# Patient Record
Sex: Female | Born: 1935 | Race: White | Hispanic: No | Smoking: Never smoker
Health system: Southern US, Community
[De-identification: ages and names within clinical notes are randomized; demographics above are authoritative.]

## PROBLEM LIST (undated history)

## (undated) ENCOUNTER — Encounter: Attending: Hematology & Oncology | Primary: Hematology & Oncology

## (undated) ENCOUNTER — Encounter

## (undated) ENCOUNTER — Ambulatory Visit

## (undated) ENCOUNTER — Non-Acute Institutional Stay: Payer: MEDICARE

## (undated) ENCOUNTER — Telehealth

## (undated) ENCOUNTER — Ambulatory Visit: Payer: MEDICARE

## (undated) ENCOUNTER — Telehealth: Attending: Hematology & Oncology | Primary: Hematology & Oncology

## (undated) ENCOUNTER — Ambulatory Visit: Payer: MEDICARE | Attending: Hematology & Oncology | Primary: Hematology & Oncology

## (undated) ENCOUNTER — Ambulatory Visit: Payer: Medicare (Managed Care)

## (undated) ENCOUNTER — Encounter: Attending: Oncology | Primary: Oncology

## (undated) ENCOUNTER — Encounter: Payer: MEDICARE | Attending: Hematology & Oncology | Primary: Hematology & Oncology

## (undated) ENCOUNTER — Ambulatory Visit: Attending: Surgery | Primary: Surgery

## (undated) ENCOUNTER — Ambulatory Visit: Payer: Medicare (Managed Care) | Attending: Hematology & Oncology | Primary: Hematology & Oncology

## (undated) ENCOUNTER — Ambulatory Visit: Attending: Hematology & Oncology | Primary: Hematology & Oncology

## (undated) ENCOUNTER — Telehealth: Attending: Surgery | Primary: Surgery

## (undated) DIAGNOSIS — E7800 Pure hypercholesterolemia, unspecified: Secondary | ICD-10-CM

## (undated) DIAGNOSIS — I10 Essential (primary) hypertension: Secondary | ICD-10-CM

## (undated) DIAGNOSIS — H9190 Unspecified hearing loss, unspecified ear: Secondary | ICD-10-CM

---

## 1997-06-29 ENCOUNTER — Other Ambulatory Visit: Admission: RE | Admit: 1997-06-29 | Discharge: 1997-06-29 | Payer: Self-pay | Admitting: Obstetrics and Gynecology

## 1999-07-18 ENCOUNTER — Encounter: Payer: Self-pay | Admitting: Obstetrics and Gynecology

## 1999-07-18 ENCOUNTER — Encounter: Admission: RE | Admit: 1999-07-18 | Discharge: 1999-07-18 | Payer: Self-pay | Admitting: Obstetrics and Gynecology

## 2000-08-07 ENCOUNTER — Encounter: Admission: RE | Admit: 2000-08-07 | Discharge: 2000-08-07 | Payer: Self-pay | Admitting: Obstetrics and Gynecology

## 2000-08-07 ENCOUNTER — Encounter: Payer: Self-pay | Admitting: Obstetrics and Gynecology

## 2001-08-12 ENCOUNTER — Encounter: Payer: Self-pay | Admitting: Obstetrics and Gynecology

## 2001-08-12 ENCOUNTER — Encounter: Admission: RE | Admit: 2001-08-12 | Discharge: 2001-08-12 | Payer: Self-pay | Admitting: Obstetrics and Gynecology

## 2002-09-09 ENCOUNTER — Encounter: Payer: Self-pay | Admitting: Obstetrics and Gynecology

## 2002-09-09 ENCOUNTER — Encounter: Admission: RE | Admit: 2002-09-09 | Discharge: 2002-09-09 | Payer: Self-pay | Admitting: Obstetrics and Gynecology

## 2003-10-05 ENCOUNTER — Encounter: Admission: RE | Admit: 2003-10-05 | Discharge: 2003-10-05 | Payer: Self-pay | Admitting: Internal Medicine

## 2006-02-12 ENCOUNTER — Encounter: Admission: RE | Admit: 2006-02-12 | Discharge: 2006-02-12 | Payer: Self-pay | Admitting: Internal Medicine

## 2007-02-24 ENCOUNTER — Encounter: Admission: RE | Admit: 2007-02-24 | Discharge: 2007-02-24 | Payer: Self-pay | Admitting: Internal Medicine

## 2008-03-15 ENCOUNTER — Encounter: Admission: RE | Admit: 2008-03-15 | Discharge: 2008-03-15 | Payer: Self-pay | Admitting: Obstetrics and Gynecology

## 2009-04-19 ENCOUNTER — Encounter: Admission: RE | Admit: 2009-04-19 | Discharge: 2009-04-19 | Payer: Self-pay | Admitting: Internal Medicine

## 2010-05-03 ENCOUNTER — Other Ambulatory Visit: Payer: Self-pay | Admitting: Internal Medicine

## 2010-05-03 DIAGNOSIS — Z1231 Encounter for screening mammogram for malignant neoplasm of breast: Secondary | ICD-10-CM

## 2010-05-23 ENCOUNTER — Ambulatory Visit
Admission: RE | Admit: 2010-05-23 | Discharge: 2010-05-23 | Disposition: A | Discharge status: Home / Self Care | Payer: Medicare Other | Source: Ambulatory Visit | Attending: Internal Medicine | Admitting: Internal Medicine

## 2010-05-23 DIAGNOSIS — Z1231 Encounter for screening mammogram for malignant neoplasm of breast: Secondary | ICD-10-CM

## 2015-01-03 ENCOUNTER — Encounter: Payer: Self-pay | Admitting: Physician Assistant

## 2015-01-24 ENCOUNTER — Other Ambulatory Visit: Payer: Self-pay | Admitting: Gastroenterology

## 2015-01-24 DIAGNOSIS — R195 Other fecal abnormalities: Secondary | ICD-10-CM

## 2015-02-01 ENCOUNTER — Ambulatory Visit: Payer: Self-pay | Admitting: Physician Assistant

## 2015-02-09 ENCOUNTER — Ambulatory Visit
Admission: RE | Admit: 2015-02-09 | Discharge: 2015-02-09 | Disposition: A | Discharge status: Home / Self Care | Payer: Medicare Other | Source: Ambulatory Visit | Attending: Gastroenterology | Admitting: Gastroenterology

## 2015-02-09 DIAGNOSIS — R195 Other fecal abnormalities: Secondary | ICD-10-CM

## 2015-02-18 ENCOUNTER — Ambulatory Visit: Payer: Self-pay | Admitting: Gastroenterology

## 2018-04-04 ENCOUNTER — Encounter (HOSPITAL_COMMUNITY): Payer: Self-pay | Admitting: *Deleted

## 2018-04-04 ENCOUNTER — Other Ambulatory Visit: Payer: Self-pay

## 2018-04-04 ENCOUNTER — Emergency Department (HOSPITAL_COMMUNITY)
Admission: EM | Admit: 2018-04-04 | Discharge: 2018-04-04 | Disposition: A | Discharge status: Home / Self Care | Payer: Medicare Other | Attending: Emergency Medicine | Admitting: Emergency Medicine

## 2018-04-04 DIAGNOSIS — I10 Essential (primary) hypertension: Secondary | ICD-10-CM | POA: Insufficient documentation

## 2018-04-04 DIAGNOSIS — Z79899 Other long term (current) drug therapy: Secondary | ICD-10-CM | POA: Diagnosis not present

## 2018-04-04 HISTORY — DX: Pure hypercholesterolemia, unspecified: E78.00

## 2018-04-04 HISTORY — DX: Essential (primary) hypertension: I10

## 2018-04-04 LAB — BASIC METABOLIC PANEL
ANION GAP: 9 (ref 5–15)
BUN: 27 mg/dL — ABNORMAL HIGH (ref 8–23)
CALCIUM: 10 mg/dL (ref 8.9–10.3)
CO2: 28 mmol/L (ref 22–32)
Chloride: 104 mmol/L (ref 98–111)
Creatinine, Ser: 0.99 mg/dL (ref 0.44–1.00)
GFR calc Af Amer: 59 mL/min — ABNORMAL LOW (ref >60)
GFR, EST NON AFRICAN AMERICAN: 51 mL/min — AB (ref >60)
GLUCOSE: 100 mg/dL — AB (ref 70–99)
Potassium: 3.9 mmol/L (ref 3.5–5.1)
Sodium: 141 mmol/L (ref 135–145)

## 2018-04-04 LAB — CBC
HCT: 40.9 % (ref 36.0–46.0)
Hemoglobin: 13.3 g/dL (ref 12.0–15.0)
MCH: 29.6 pg (ref 26.0–34.0)
MCHC: 32.5 g/dL (ref 30.0–36.0)
MCV: 91.1 fL (ref 80.0–100.0)
PLATELETS: 242 10*3/uL (ref 150–400)
RBC: 4.49 MIL/uL (ref 3.87–5.11)
RDW: 12.8 % (ref 11.5–15.5)
WBC: 6.9 10*3/uL (ref 4.0–10.5)
nRBC: 0 % (ref 0.0–0.2)

## 2018-04-04 MED ORDER — AMLODIPINE BESYLATE 5 MG PO TABS: 5.0000 mg | ORAL_TABLET | Freq: Every day | ORAL | 0 refills | Status: DC

## 2018-04-04 MED ORDER — AMLODIPINE BESYLATE 5 MG PO TABS
5.0000 mg | ORAL_TABLET | Freq: Once | ORAL | Status: AC
  Administered 2018-04-04: 5 mg via ORAL
  Filled 2018-04-04: qty 1

## 2018-04-04 NOTE — ED Triage Notes (Addendum)
Pt here from home for hypertension.  She randomly took her bp today and bp was 270/114.  Immediately experienced lightheadedness after seeing the numbers.  Lightheadedness has resolved.  Denies any headaches or any other neuro symptoms at this time.  Pt has had no changes in her hctz medications.

## 2018-04-04 NOTE — ED Notes (Signed)
Checked BP in right and left arm. RN Freida Busman informed and findings documented in chart

## 2018-04-04 NOTE — ED Notes (Signed)
Pt took home doses of her benicar 40mg  and HCTZ 25mg  tablets PO. Witnessed by [employer]. MD aware.

## 2018-04-04 NOTE — ED Notes (Signed)
Sts hasn't taken her HCTZ yet tonight. No recent changes to meds, no recent f/u with PCP.

## 2018-04-04 NOTE — ED Notes (Signed)
AVS reviewed with pt by Hervey Ard, RN. Pt expressed understanding and had no outstanding questions. Departed with family and in NAD.

## 2018-04-04 NOTE — ED Provider Notes (Signed)
[facility] Provider Note   CSN: [id number] Arrival date & time: 04/04/18  1804     History   Chief Complaint Chief Complaint  Patient presents with  . Hypertension    HPI Brandy Miranda is a 85 y.o. female.  Patient took her blood pressure this morning at home and noted to be 235 systolic.  She was asymptomatic.  Just taking her blood pressure as a matter of routine.  A few minutes after taking her blood pressure she became lightheaded for several minutes.  Symptoms resolved spontaneously.  She is presently asymptomatic without treatment.  She denies any chest pain denies abdominal pain denies headache denies visual changes no other complaint. Takes Benicar and HCTZ at night. HPI  Past Medical History:  Diagnosis Date  . Hypercholesteremia   . Hypertension     There are no active problems to display for this patient.   History reviewed. No pertinent surgical history.   OB History   No obstetric history on file.      Home Medications    Prior to Admission medications   Not on File  Benicar 40 mg daily  HCTZ 40 mg daily Atorvastatin 20 mg daily  Family History No family history on file.  Social History Social History   Tobacco Use  . Smoking status: Never Smoker  . Smokeless tobacco: Never Used  Substance Use Topics  . Alcohol use: Yes    Comment: occ  . Drug use: Never     Allergies   Patient has no known allergies.   Review of Systems Review of Systems  Constitutional: Negative.   HENT: Negative.   Respiratory: Negative.   Cardiovascular: Negative.   Gastrointestinal: Negative.   Musculoskeletal: Negative.   Skin: Negative.   Neurological: Positive for light-headedness.       Transient lightheadedness  Psychiatric/Behavioral: Negative.   All other systems reviewed and are negative.    Physical Exam Updated Vital Signs BP (!) 177/135   Pulse 91   Temp 97.9 F (36.6 C) (Oral)   Resp 18   Ht 5'  2.25" (1.581 m)   Wt 63.5 kg   SpO2 94%   BMI 25.40 kg/m   Physical Exam Vitals signs and nursing note reviewed.  Constitutional:      Appearance: She is well-developed.  HENT:     Head: Normocephalic and atraumatic.  Eyes:     Conjunctiva/sclera: Conjunctivae normal.     Pupils: Pupils are equal, round, and reactive to light.  Neck:     Musculoskeletal: Neck supple.     Thyroid: No thyromegaly.     Trachea: No tracheal deviation.  Cardiovascular:     Rate and Rhythm: Normal rate and regular rhythm.     Heart sounds: No murmur.  Pulmonary:     Effort: Pulmonary effort is normal.     Breath sounds: Normal breath sounds.  Abdominal:     General: Bowel sounds are normal. There is no distension.     Palpations: Abdomen is soft.     Tenderness: There is no abdominal tenderness.  Musculoskeletal: Normal range of motion.        General: No tenderness.  Skin:    General: Skin is warm and dry.     Findings: No rash.  Neurological:     Mental Status: She is alert and oriented to person, place, and time.     Cranial Nerves: No cranial nerve deficit.     Sensory: No  sensory deficit.     Coordination: Coordination normal.      ED Treatments / Results  Labs (all labs ordered are listed, but only abnormal results are displayed) Labs Reviewed - No data to display  EKG EKG Interpretation  Date/Time:  Friday April 04 2018 18:06:06 EST Ventricular Rate:  96 PR Interval:    QRS Duration: 96 QT Interval:  365 QTC Calculation: 462 R Axis:   39 Text Interpretation:  Sinus rhythm PVC Nonspecific T wave abnormality No old tracing to compare Confirmed by [place], Inocente Salles [phone]) on 04/04/2018 6:13:48 PM   Radiology No results found.  Procedures Procedures (including critical care time)  Medications Ordered in ED Medications - No data to display   Initial Impression / Assessment and Plan / ED Course  I have reviewed the triage vital signs and the nursing  notes.  Pertinent labs & imaging results that were available during my care of the patient were reviewed by me and considered in my medical decision making (see chart for details).     8:30 PM patient remains asymptomatic.  Plan discussed with hospital pharmacist will add Norvasc 5 mg daily to her medication regimen.  Blood pressure recheck 1 week at [place] office   There is no evidence of endorgan damage note the patient has discrepancy in blood pressures in her arms.  She has not exhibits no signs of aortic dissection.  She has had no pain.  I feel that this is likely an anomaly  Final Clinical Impressions(s) / ED Diagnoses  Diagnoses hypertension Final diagnoses:  None    ED Discharge Orders    None       Orlie Dakin, MD 04/04/18 2034

## 2018-04-04 NOTE — Discharge Instructions (Signed)
Call Dr. Keane Police office on Monday, 04/07/2020 arrange to get your blood pressure rechecked in his office next week.

## 2018-04-08 ENCOUNTER — Other Ambulatory Visit (HOSPITAL_COMMUNITY): Payer: Self-pay | Admitting: Internal Medicine

## 2018-04-08 ENCOUNTER — Other Ambulatory Visit: Payer: Self-pay | Admitting: Internal Medicine

## 2018-04-09 ENCOUNTER — Other Ambulatory Visit (HOSPITAL_COMMUNITY): Payer: Self-pay | Admitting: Internal Medicine

## 2018-04-09 ENCOUNTER — Other Ambulatory Visit: Payer: Self-pay | Admitting: Internal Medicine

## 2018-04-09 DIAGNOSIS — R011 Cardiac murmur, unspecified: Secondary | ICD-10-CM

## 2018-04-09 DIAGNOSIS — I129 Hypertensive chronic kidney disease with stage 1 through stage 4 chronic kidney disease, or unspecified chronic kidney disease: Secondary | ICD-10-CM

## 2018-04-11 ENCOUNTER — Ambulatory Visit (HOSPITAL_COMMUNITY): Payer: Medicare Other | Attending: Cardiology

## 2018-04-11 DIAGNOSIS — I129 Hypertensive chronic kidney disease with stage 1 through stage 4 chronic kidney disease, or unspecified chronic kidney disease: Secondary | ICD-10-CM | POA: Diagnosis present

## 2018-04-11 DIAGNOSIS — R011 Cardiac murmur, unspecified: Secondary | ICD-10-CM | POA: Insufficient documentation

## 2019-03-06 DIAGNOSIS — C801 Malignant (primary) neoplasm, unspecified: Secondary | ICD-10-CM

## 2019-03-06 HISTORY — DX: Malignant (primary) neoplasm, unspecified: C80.1

## 2019-10-19 ENCOUNTER — Other Ambulatory Visit: Payer: Self-pay | Admitting: General Surgery

## 2019-10-19 DIAGNOSIS — C4359 Malignant melanoma of other part of trunk: Secondary | ICD-10-CM

## 2019-10-29 ENCOUNTER — Encounter (HOSPITAL_COMMUNITY): Payer: Self-pay

## 2019-10-29 ENCOUNTER — Ambulatory Visit (HOSPITAL_COMMUNITY)
Admission: RE | Admit: 2019-10-29 | Discharge: 2019-10-29 | Disposition: A | Discharge status: Home / Self Care | Payer: Medicare Other | Source: Ambulatory Visit | Attending: General Surgery | Admitting: General Surgery

## 2019-10-29 ENCOUNTER — Encounter (HOSPITAL_COMMUNITY)
Admission: RE | Admit: 2019-10-29 | Discharge: 2019-10-29 | Disposition: A | Discharge status: Home / Self Care | Payer: Medicare Other | Source: Ambulatory Visit | Attending: General Surgery | Admitting: General Surgery

## 2019-10-29 ENCOUNTER — Other Ambulatory Visit: Payer: Self-pay

## 2019-10-29 DIAGNOSIS — D0359 Melanoma in situ of other part of trunk: Secondary | ICD-10-CM | POA: Insufficient documentation

## 2019-10-29 HISTORY — DX: Unspecified hearing loss, unspecified ear: H91.90

## 2019-10-29 LAB — CBC WITH DIFFERENTIAL/PLATELET
Abs Immature Granulocytes: 0.03 10*3/uL (ref 0.00–0.07)
Basophils Absolute: 0 10*3/uL (ref 0.0–0.1)
Basophils Relative: 0 %
Eosinophils Absolute: 0 10*3/uL (ref 0.0–0.5)
Eosinophils Relative: 0 %
HCT: 40.5 % (ref 36.0–46.0)
Hemoglobin: 12.9 g/dL (ref 12.0–15.0)
Immature Granulocytes: 0 %
Lymphocytes Relative: 28 %
Lymphs Abs: 2.1 10*3/uL (ref 0.7–4.0)
MCH: 29.7 pg (ref 26.0–34.0)
MCHC: 31.9 g/dL (ref 30.0–36.0)
MCV: 93.3 fL (ref 80.0–100.0)
Monocytes Absolute: 0.7 10*3/uL (ref 0.1–1.0)
Monocytes Relative: 9 %
Neutro Abs: 4.7 10*3/uL (ref 1.7–7.7)
Neutrophils Relative %: 63 %
Platelets: 270 10*3/uL (ref 150–400)
RBC: 4.34 MIL/uL (ref 3.87–5.11)
RDW: 12.8 % (ref 11.5–15.5)
WBC: 7.5 10*3/uL (ref 4.0–10.5)
nRBC: 0 % (ref 0.0–0.2)

## 2019-10-29 LAB — COMPREHENSIVE METABOLIC PANEL
ALT: 18 U/L (ref 0–44)
AST: 21 U/L (ref 15–41)
Albumin: 3.9 g/dL (ref 3.5–5.0)
Alkaline Phosphatase: 44 U/L (ref 38–126)
Anion gap: 8 (ref 5–15)
BUN: 19 mg/dL (ref 8–23)
CO2: 28 mmol/L (ref 22–32)
Calcium: 9.1 mg/dL (ref 8.9–10.3)
Chloride: 103 mmol/L (ref 98–111)
Creatinine, Ser: 0.98 mg/dL (ref 0.44–1.00)
GFR calc Af Amer: 58 mL/min — ABNORMAL LOW (ref >60)
GFR calc non Af Amer: 50 mL/min — ABNORMAL LOW (ref >60)
Glucose, Bld: 94 mg/dL (ref 70–99)
Potassium: 3.9 mmol/L (ref 3.5–5.1)
Sodium: 139 mmol/L (ref 135–145)
Total Bilirubin: 0.7 mg/dL (ref 0.3–1.2)
Total Protein: 7.1 g/dL (ref 6.5–8.1)

## 2019-10-29 NOTE — Progress Notes (Signed)
Anesthesia Chart Review:  Case: [id number] Date/Time: 11/04/19 0845   Procedure: WIDE LOCAL EXCISION  LEFT UPPER BACK MELANOMA WITH ADVANCEMENT FLAP CLOSURE,  SENTINEL LYMPH NODE BIOPSY (Left )   Anesthesia type: General   Pre-op diagnosis: LEFT UPPER BACK MELANOMA   Location: [facility] OR ROOM 02 / [facility]   Surgeons: [name], MD      DISCUSSION: Patient is a 86 year old Miranda scheduled for the above procedure.  History includes never smoker, HTN, hypercholesterolemia, hard of hearing, melanoma (2021).   Reported last ASA 10/27/19. 2020 echo (ordered by PCP for HTN, murmur) showed LVEF > 65%, impaired diastolic relaxation, mild mitral annular calcification, moderate aortic valve calcification with no AS by Doppler. EKG showed SR with sinus arrhythmia. She denied chest pain, SOB, cough, fever at PAT RN visit.  Labs unremarkable.  Preoperative COVID-19 testing is scheduled for 10/31/2019. Anesthesia team to evaluate on the day of surgery.    VS: BP (!) 143/61   Pulse 79   Temp 36.7 C (Oral)   Resp 18   Ht 5' 2.25" (1.581 m)   Wt 61.8 kg   SpO2 99%   BMI 24.73 kg/m   PROVIDERS: Shon Baton, MD his PCP   LABS: Labs reviewed: Acceptable for surgery. (all labs ordered are listed, but only abnormal results are displayed)  Labs Reviewed  COMPREHENSIVE METABOLIC PANEL - Abnormal; Notable for the following components:      Result Value   GFR calc non Af Amer 50 (*)    GFR calc Af Amer Brandy (*)    All other components within normal limits  CBC WITH DIFFERENTIAL/PLATELET    IMAGES: CXR 10/29/19:  FINDINGS: Heart and mediastinal contours are within normal limits. No focal opacities or effusions. No acute bony abnormality. IMPRESSION: No active cardiopulmonary disease.   EKG: 10/29/19:  Normal sinus rhythm with sinus arrhythmia Possible Left atrial enlargement Borderline ECG Confirmed by Croitoru, Mihai [phone]) on 10/29/2019 1:41:44 PM   CV: Echo 04/11/18: IMPRESSIONS  1. The  left ventricle has hyperdynamic systolic function of >36%. The  cavity size was normal. There is no increased left ventricular wall  thickness. Echo evidence of impaired diastolic relaxation.  2. The right ventricle has normal systolic function. The cavity was  normal. There is no increase in right ventricular wall thickness.  3. The mitral valve is normal in structure. There is mild mitral annular  calcification present.  4. The tricuspid valve is normal in structure.  5. The aortic valve has an indeterminant number of cusps There is  moderate calcification of the aortic valve.  6. The pulmonic valve was normal in structure.  7. Vigorous LV systolic function; mild diastolic dysfunction; calcified  aortic valve but no AS by doppler.    Past Medical History:  Diagnosis Date  . Cancer ([place]) 2021   Melanoma, back  . HOH (hard of hearing)    Wears bilateral hearing aids  . Hypercholesteremia   . Hypertension     History reviewed. No pertinent surgical history.  MEDICATIONS: . amLODipine (NORVASC) 10 MG tablet  . amLODipine (NORVASC) 5 MG tablet  . Ascorbic Acid (VITAMIN C PO)  . aspirin EC 81 MG tablet  . atorvastatin (LIPITOR) 20 MG tablet  . hydrochlorothiazide (HYDRODIURIL) 25 MG tablet  . olmesartan (BENICAR) 40 MG tablet  . Olopatadine HCl (PATADAY OP)  . OVER THE COUNTER MEDICATION  . OVER THE COUNTER MEDICATION  . OVER THE COUNTER MEDICATION  . OVER THE COUNTER MEDICATION  .  triamcinolone ointment (KENALOG) 0.1 %   No current facility-administered medications for this encounter.     Myra Gianotti, PA-C Surgical Short Stay/Anesthesiology [facility] Phone [phone] [facility] Phone [phone] 10/29/2019 6:06 PM

## 2019-10-29 NOTE — Progress Notes (Addendum)
PCP - Shon Baton, MD Cardiologist - Denies  PPM/ICD - Denies  Chest x-ray - 10/29/19 EKG - 10/29/19 Stress Test - Denies ECHO - 04/11/18 Cardiac Cath - Denies  Sleep Study - Denies CPAP -  N/A  Patient denies being diabetic.  Blood Thinner Instructions: N/A Aspirin Instructions: Per patient, last dose 10/27/19  ERAS Protcol - Yes PRE-SURGERY Ensure or G2- Not ordered  COVID TEST- 10/31/19   Anesthesia review: Review echo; abnormal EKG.   Patient denies shortness of breath, fever, cough and chest pain at PAT appointment   All instructions explained to the patient, with a verbal understanding of the material. Patient agrees to go over the instructions while at home for a better understanding. Patient also instructed to self quarantine after being tested for COVID-19. The opportunity to ask questions was provided.

## 2019-10-29 NOTE — Anesthesia Preprocedure Evaluation (Addendum)
Anesthesia Evaluation  Patient identified by MRN, date of birth, ID band Patient awake    Reviewed: Allergy & Precautions, NPO status , Patient's Chart, lab work & pertinent test results  History of Anesthesia Complications Negative for: history of anesthetic complications  Airway Mallampati: II  TM Distance: >3 FB Neck ROM: Full    Dental no notable dental hx.    Pulmonary neg pulmonary ROS,    Pulmonary exam normal        Cardiovascular hypertension, Pt. on medications Normal cardiovascular exam     Neuro/Psych negative neurological ROS  negative psych ROS   GI/Hepatic negative GI ROS, Neg liver ROS,   Endo/Other  negative endocrine ROS  Renal/GU negative Renal ROS  negative genitourinary   Musculoskeletal negative musculoskeletal ROS (+)   Abdominal   Peds  Hematology negative hematology ROS (+)   Anesthesia Other Findings Left upper back melanoma  Reproductive/Obstetrics negative OB ROS                           Anesthesia Physical Anesthesia Plan  ASA: II  Anesthesia Plan: General   Post-op Pain Management:    Induction: Intravenous  PONV Risk Score and Plan: 3 and Treatment may vary due to age or medical condition, Ondansetron and Dexamethasone  Airway Management Planned: Oral ETT  Additional Equipment: None  Intra-op Plan:   Post-operative Plan: Extubation in OR  Informed Consent: I have reviewed the patients History and Physical, chart, labs and discussed the procedure including the risks, benefits and alternatives for the proposed anesthesia with the patient or authorized representative who has indicated his/her understanding and acceptance.     Dental advisory given and Consent reviewed with POA  Plan Discussed with: CRNA  Anesthesia Plan Comments: (Consent discussed with patient and patient's daughter in preop room. Daiva Huge, MD )      Anesthesia Quick  Evaluation

## 2019-10-29 NOTE — Pre-Procedure Instructions (Signed)
Your procedure is scheduled on Wednesday, September 1, from 09:00 AM- 10:30 AM.  Report to [facility] Main Entrance "A" at 07:00 A.M., and check in at the Admitting office.  Call this number if you have problems the morning of surgery:  [phone]  Call [phone] if you have any questions prior to your surgery date Monday-Friday 8am-4pm.    Remember:  Do not eat after midnight the night before your surgery.  You may drink clear liquids until 06:00 AM the morning of your surgery.   Clear liquids allowed are: Water, Non-Citrus Juices (without pulp), Carbonated Beverages, Clear Tea, Black Coffee Only, and Gatorade.    Take these medicines the morning of surgery with A SIP OF WATER: amLODipine (NORVASC) atorvastatin (LIPITOR)  Olopatadine HCl (PATADAY OP) eye drops- if needed   Follow your surgeon's instructions on when to stop Aspirin.  If no instructions were given by your surgeon then you will need to call the office to get those instructions.    As of today, STOP taking any Aleve, Naproxen, Ibuprofen, Motrin, Advil, Goody's, BC's, all herbal medications, fish oil, and all vitamins.     The Morning of Surgery:                 Do not wear jewelry, make up, or nail polish.            Do not wear lotions, powders, perfumes, or deodorant.            Do not shave 48 hours prior to surgery.              Do not bring valuables to the hospital.            [facility] is not responsible for any belongings or valuables.  Do NOT Smoke (Tobacco/Vaping) or drink Alcohol 24 hours prior to your procedure.  If you use a CPAP at night, you may bring all equipment for your overnight stay.   Contacts, glasses, dentures or bridgework may not be worn into surgery.      For patients admitted to the hospital, discharge time will be determined by your treatment team.   Patients discharged the day of surgery will not be allowed to drive home, and someone needs to stay with them for 24  hours.    Special instructions:   [place]- Preparing For Surgery  Before surgery, you can play an important role. Because skin is not sterile, your skin needs to be as free of germs as possible. You can reduce the number of germs on your skin by washing with CHG (chlorahexidine gluconate) Soap before surgery.  CHG is an antiseptic cleaner which kills germs and bonds with the skin to continue killing germs even after washing.    Oral Hygiene is also important to reduce your risk of infection.  Remember - BRUSH YOUR TEETH THE MORNING OF SURGERY WITH YOUR REGULAR TOOTHPASTE  Please do not use if you have an allergy to CHG or antibacterial soaps. If your skin becomes reddened/irritated stop using the CHG.  Do not shave (including legs and underarms) for at least 48 hours prior to first CHG shower. It is OK to shave your face.  Please follow these instructions carefully.   1. Shower the NIGHT BEFORE SURGERY and the MORNING OF SURGERY with CHG Soap.   2. If you chose to wash your hair, wash your hair first as usual with your normal shampoo.  3. After you shampoo, rinse your hair  and body thoroughly to remove the shampoo.  4. Use CHG as you would any other liquid soap. You can apply CHG directly to the skin and wash gently with a scrungie or a clean washcloth.   5. Apply the CHG Soap to your body ONLY FROM THE NECK DOWN.  Do not use on open wounds or open sores. Avoid contact with your eyes, ears, mouth and genitals (private parts). Wash Face and genitals (private parts)  with your normal soap.   6. Wash thoroughly, paying special attention to the area where your surgery will be performed.  7. Thoroughly rinse your body with warm water from the neck down.  8. DO NOT shower/wash with your normal soap after using and rinsing off the CHG Soap.  9. Pat yourself dry with a CLEAN TOWEL.  10. Wear CLEAN PAJAMAS to bed the night before surgery  11. Place CLEAN SHEETS on your bed the night of  your first shower and DO NOT SLEEP WITH PETS.   Day of Surgery: Wear Clean/Comfortable clothing the morning of surgery. Do not apply any deodorants/lotions.   Remember to brush your teeth WITH YOUR REGULAR TOOTHPASTE.   Please read over the following fact sheets that you were given.

## 2019-10-31 ENCOUNTER — Other Ambulatory Visit (HOSPITAL_COMMUNITY)
Admission: RE | Admit: 2019-10-31 | Discharge: 2019-10-31 | Disposition: A | Discharge status: Home / Self Care | Payer: Medicare Other | Source: Ambulatory Visit | Attending: General Surgery | Admitting: General Surgery

## 2019-10-31 DIAGNOSIS — Z01812 Encounter for preprocedural laboratory examination: Secondary | ICD-10-CM | POA: Diagnosis present

## 2019-10-31 DIAGNOSIS — Z20822 Contact with and (suspected) exposure to covid-19: Secondary | ICD-10-CM | POA: Diagnosis not present

## 2019-10-31 LAB — SARS CORONAVIRUS 2 (TAT 6-24 HRS): SARS Coronavirus 2: NEGATIVE

## 2019-11-02 NOTE — H&P (Signed)
Brandy Miranda Appointment: 10/19/2019 9:30 AM Location: [place] Office Patient #: [id number] DOB: September 28, 1926 Widowed / Language: Brandy Miranda / Race: [place]ra[place] Female   History of Present Illness Brandy Miranda; 10/19/2019 7:48 PM) The patient is a 86 year old female who presents with malignant melanoma. Pt is a 86 yo F who is referred for melanoma of the left upper back by Dr. Martin Miranda. This is dx 10/2019. She noted some itching on the back and felt something there. It was originally flat, but started to become raised over a month or so. She called for an appointment and biopsy was positive for melanoma. She isn't sure what the lesion looked like, but her daughter who is present, thought it looked a bit like a blister.  Biopsy showed a 3.6 mm melanoma that was superficial spreading and nodular. This had positive peripheral margins and negative deep margins. There was no ulceration, satellitosis, LVI, neurotropism, or regression. Numerous mitotic figures were seen at 11/mm2. She had non brisk TILs. This was staged as a pT3a.    She has no personal or family history of cancer. She has never had surgery. She walks multiple miles every day.  [name] dx [id number]   Past Surgical History (Brandy Miranda, CMA; 10/19/2019 9:39 AM) No pertinent past surgical history   Diagnostic Studies History (Brandy Miranda, [place]; 10/19/2019 9:39 AM) Colonoscopy  1-5 years ago  Allergies (Brandy Miranda, CMA; 10/19/2019 9:40 AM) No Known Drug Allergies  [10/19/2019]:  Medication History (Brandy Miranda, CMA; 10/19/2019 9:43 AM) Multi-Vitamin (Oral) Active. Essential Oils Active. amLODIPine Besylate (5MG  Tablet, Oral) Active. Aspirin (81MG  Tablet, Oral) Active. Lipitor (20MG  Tablet, Oral) Active. hydroCHLOROthiazide (25MG  Tablet, Oral) Active. Benicar (40MG  Tablet, Oral) Active. Medications Reconciled  Social History (Brandy Miranda, CMA; 10/19/2019 9:39 AM) Tobacco use  Never smoker.  Family  History (Brandy Miranda, [place]; 10/19/2019 9:39 AM) Alcohol Abuse  Son.  Pregnancy / Birth History (Brandy Miranda, [place]; 10/19/2019 9:39 AM) Age at menarche  19 years. Gravida  5 Maternal age  25-30 Para  68  Other Problems (Brandy Miranda, CMA; 10/19/2019 9:39 AM) Heart murmur  High blood pressure  Hypercholesterolemia     Review of Systems (Brandy Miranda CMA; 10/19/2019 9:39 AM) General Present- Weight Loss. Not Present- Appetite Loss, Chills, Fatigue, Fever, Night Sweats and Weight Gain. Skin Present- Dryness. Not Present- Change in Wart/Mole, Hives, Jaundice, New Lesions, Non-Healing Wounds, Rash and Ulcer. HEENT Present- Hearing Loss. Not Present- Earache, Hoarseness, Nose Bleed, Oral Ulcers, Ringing in the Ears, Seasonal Allergies, Sinus Pain, Sore Throat, Visual Disturbances, Wears glasses/contact lenses and Yellow Eyes. Female Genitourinary Present- Nocturia. Not Present- Frequency, Painful Urination, Pelvic Pain and Urgency. Musculoskeletal Present- Joint Stiffness. Not Present- Back Pain, Joint Pain, Muscle Pain, Muscle Weakness and Swelling of Extremities. Neurological Not Present- Decreased Memory, Fainting, Headaches, Numbness, Seizures, Tingling, Tremor, Trouble walking and Weakness. Psychiatric Not Present- Anxiety, Bipolar, Change in Sleep Pattern, Depression, Fearful and Frequent crying.  Vitals (Brandy Miranda CMA; 10/19/2019 9:44 AM) 10/19/2019 9:43 AM Weight: 135.38 lb Height: 62.25in Body Surface Area: 1.62 m Body Mass Index: 24.56 kg/m  Temp.: 97.52F (Temporal)  Pulse: 86 (Regular)  P.OX: 97% (Room air) BP: 140/70(Sitting, Left Arm, Standard)       Physical Exam Brandy Miranda; 10/19/2019 7:49 PM) General Mental Status-Alert. General Appearance-Consistent with stated age. Hydration-Well hydrated. Voice-Normal.  Integumentary Note: shave biopsy site still a bit raw on left upper back in bra line. no residual pigment noted. no other  nodules or lesions noted  nearby.   Head and Neck Head-normocephalic, atraumatic with no lesions or palpable masses. Trachea-midline. Thyroid Gland Characteristics - normal size and consistency.  Eye Eyeball - Bilateral-Extraocular movements intact. Sclera/Conjunctiva - Bilateral-No scleral icterus.  Chest and Lung Exam Chest and lung exam reveals -quiet, even and easy respiratory effort with no use of accessory muscles and on auscultation, normal breath sounds, no adventitious sounds and normal vocal resonance. Inspection Chest Wall - Normal. Back - normal.  Cardiovascular Cardiovascular examination reveals -normal heart sounds, regular rate and rhythm with no murmurs and normal pedal pulses bilaterally.  Abdomen Inspection Inspection of the abdomen reveals - No Hernias. Palpation/Percussion Palpation and Percussion of the abdomen reveal - Soft, Non Tender, No Rebound tenderness, No Rigidity (guarding) and No hepatosplenomegaly. Auscultation Auscultation of the abdomen reveals - Bowel sounds normal.  Neurologic Neurologic evaluation reveals -alert and oriented x 3 with no impairment of recent or remote memory. Mental Status-Normal.  Musculoskeletal Global Assessment -Note: no gross deformities.  Normal Exam - Left-Upper Extremity Strength Normal and Lower Extremity Strength Normal. Normal Exam - Right-Upper Extremity Strength Normal and Lower Extremity Strength Normal.  Lymphatic Head & Neck  General Head & Neck Lymphatics: Bilateral - Description - Normal. Axillary  General Axillary Region: Bilateral - Description - Normal. Tenderness - Non Tender. Femoral & Inguinal  Generalized Femoral & Inguinal Lymphatics: Bilateral - Description - No Generalized lymphadenopathy.    Assessment & Plan Brandy Miranda; 10/19/2019 7:53 PM) MALIGNANT MELANOMA OF UPPER BACK (C43.59) Impression: Pt will need WLE and advancement flap closure 2 cm margins of  this melanoma. She is 76, but in excellent shape with a mother that lived to over 80. I will plan SLN bx for this as well. The nodular portion of the melanoma is a higher risk lesion. I discussed surgery in detail with pt and daughter.  I reviewed risks and recovery. I discussed post op expectations of numbness at incision and some central sutures that will have to be removed. I discussed risk of wound breakdown. I also discussed risk of cancer recurrence. will do this as soon as possible. Current Plans You are being scheduled for surgery- Our schedulers will call you.  You should hear from our office's scheduling department within 5 working days about the location, date, and time of surgery. We try to make accommodations for patient's preferences in scheduling surgery, but sometimes the OR schedule or the surgeon's schedule prevents [place] from making those accommodations.  If you have not heard from our office [phone]) in 5 working days, call the office and ask for your surgeon's nurse.  If you have other questions about your diagnosis, plan, or surgery, call the office and ask for your surgeon's nurse.  Pt Education - Melanoma: melanoma Pt Education - [facility] Free Text Education/Instructions: discussed with patient and provided information.   Signed electronically by Brandy Klein, Miranda (10/19/2019 7:53 PM)

## 2019-11-04 ENCOUNTER — Encounter (HOSPITAL_COMMUNITY): Payer: Self-pay | Admitting: General Surgery

## 2019-11-04 ENCOUNTER — Encounter (HOSPITAL_COMMUNITY)
Admission: RE | Admit: 2019-11-04 | Discharge: 2019-11-04 | Disposition: A | Discharge status: Home / Self Care | Payer: Medicare Other | Source: Ambulatory Visit | Attending: General Surgery | Admitting: General Surgery

## 2019-11-04 ENCOUNTER — Ambulatory Visit (HOSPITAL_COMMUNITY): Payer: Medicare Other | Admitting: Vascular Surgery

## 2019-11-04 ENCOUNTER — Encounter (HOSPITAL_COMMUNITY)
Admission: RE | Disposition: A | Discharge status: Home / Self Care | Payer: Self-pay | Source: Home / Self Care | Attending: General Surgery

## 2019-11-04 ENCOUNTER — Ambulatory Visit (HOSPITAL_COMMUNITY): Payer: Medicare Other | Admitting: Anesthesiology

## 2019-11-04 ENCOUNTER — Ambulatory Visit (HOSPITAL_COMMUNITY)
Admission: RE | Admit: 2019-11-04 | Discharge: 2019-11-04 | Disposition: A | Discharge status: Home / Self Care | Payer: Medicare Other | Attending: General Surgery | Admitting: General Surgery

## 2019-11-04 ENCOUNTER — Other Ambulatory Visit: Payer: Self-pay

## 2019-11-04 DIAGNOSIS — C4359 Malignant melanoma of other part of trunk: Secondary | ICD-10-CM | POA: Insufficient documentation

## 2019-11-04 DIAGNOSIS — R011 Cardiac murmur, unspecified: Secondary | ICD-10-CM | POA: Insufficient documentation

## 2019-11-04 DIAGNOSIS — C773 Secondary and unspecified malignant neoplasm of axilla and upper limb lymph nodes: Secondary | ICD-10-CM | POA: Diagnosis not present

## 2019-11-04 DIAGNOSIS — Z811 Family history of alcohol abuse and dependence: Secondary | ICD-10-CM | POA: Diagnosis not present

## 2019-11-04 DIAGNOSIS — Z79899 Other long term (current) drug therapy: Secondary | ICD-10-CM | POA: Insufficient documentation

## 2019-11-04 DIAGNOSIS — E7800 Pure hypercholesterolemia, unspecified: Secondary | ICD-10-CM | POA: Insufficient documentation

## 2019-11-04 DIAGNOSIS — I10 Essential (primary) hypertension: Secondary | ICD-10-CM | POA: Diagnosis not present

## 2019-11-04 HISTORY — PX: MELANOMA EXCISION WITH SENTINEL LYMPH NODE BIOPSY: SHX5267

## 2019-11-04 HISTORY — PX: SENTINEL NODE BIOPSY: SHX6608

## 2019-11-04 SURGERY — MELANOMA EXCISION WITH SENTINEL LYMPH NODE BIOPSY
Anesthesia: General | Site: Back | Laterality: Left

## 2019-11-04 MED ORDER — SODIUM CHLORIDE (PF) 0.9 % IJ SOLN
INTRAMUSCULAR | Status: AC
  Filled 2019-11-04: qty 10

## 2019-11-04 MED ORDER — ACETAMINOPHEN 500 MG PO TABS
1000.0000 mg | ORAL_TABLET | Freq: Once | ORAL | Status: AC
  Administered 2019-11-04: 1000 mg via ORAL
  Filled 2019-11-04: qty 2

## 2019-11-04 MED ORDER — PHENYLEPHRINE 40 MCG/ML (10ML) SYRINGE FOR IV PUSH (FOR BLOOD PRESSURE SUPPORT)
PREFILLED_SYRINGE | INTRAVENOUS | Status: DC | PRN
  Administered 2019-11-04 (×2): 80 ug via INTRAVENOUS
  Administered 2019-11-04: 40 ug via INTRAVENOUS
  Administered 2019-11-04: 120 ug via INTRAVENOUS
  Administered 2019-11-04: 80 ug via INTRAVENOUS

## 2019-11-04 MED ORDER — LIDOCAINE-EPINEPHRINE 1 %-1:100000 IJ SOLN
INTRAMUSCULAR | Status: AC
  Filled 2019-11-04: qty 1

## 2019-11-04 MED ORDER — DEXAMETHASONE SODIUM PHOSPHATE 4 MG/ML IJ SOLN
INTRAMUSCULAR | Status: DC | PRN
  Administered 2019-11-04: 4 mg via INTRAVENOUS

## 2019-11-04 MED ORDER — PROMETHAZINE HCL 25 MG/ML IJ SOLN: 6.2500 mg | INTRAMUSCULAR | Status: DC | PRN

## 2019-11-04 MED ORDER — BUPIVACAINE-EPINEPHRINE (PF) 0.25% -1:200000 IJ SOLN
INTRAMUSCULAR | Status: AC
  Filled 2019-11-04: qty 30

## 2019-11-04 MED ORDER — DEXAMETHASONE SODIUM PHOSPHATE 10 MG/ML IJ SOLN
INTRAMUSCULAR | Status: AC
  Filled 2019-11-04: qty 1

## 2019-11-04 MED ORDER — MIDAZOLAM HCL 2 MG/2ML IJ SOLN
INTRAMUSCULAR | Status: AC
  Filled 2019-11-04: qty 2

## 2019-11-04 MED ORDER — CEFAZOLIN SODIUM-DEXTROSE 2-4 GM/100ML-% IV SOLN
2.0000 g | INTRAVENOUS | Status: AC
  Administered 2019-11-04: 2 g via INTRAVENOUS
  Filled 2019-11-04: qty 100

## 2019-11-04 MED ORDER — FENTANYL CITRATE (PF) 100 MCG/2ML IJ SOLN: 25.0000 ug | INTRAMUSCULAR | Status: DC | PRN

## 2019-11-04 MED ORDER — LIDOCAINE 2% (20 MG/ML) 5 ML SYRINGE
INTRAMUSCULAR | Status: DC | PRN
  Administered 2019-11-04: 60 mg via INTRAVENOUS

## 2019-11-04 MED ORDER — LIDOCAINE 2% (20 MG/ML) 5 ML SYRINGE
INTRAMUSCULAR | Status: AC
  Filled 2019-11-04: qty 5

## 2019-11-04 MED ORDER — ONDANSETRON HCL 4 MG/2ML IJ SOLN
INTRAMUSCULAR | Status: DC | PRN
  Administered 2019-11-04: 4 mg via INTRAVENOUS

## 2019-11-04 MED ORDER — ROCURONIUM BROMIDE 10 MG/ML (PF) SYRINGE
PREFILLED_SYRINGE | INTRAVENOUS | Status: DC | PRN
  Administered 2019-11-04: 40 mg via INTRAVENOUS

## 2019-11-04 MED ORDER — ONDANSETRON HCL 4 MG/2ML IJ SOLN
INTRAMUSCULAR | Status: AC
  Filled 2019-11-04: qty 2

## 2019-11-04 MED ORDER — ROCURONIUM BROMIDE 10 MG/ML (PF) SYRINGE
PREFILLED_SYRINGE | INTRAVENOUS | Status: AC
  Filled 2019-11-04: qty 10

## 2019-11-04 MED ORDER — CHLORHEXIDINE GLUCONATE CLOTH 2 % EX PADS: 6.0000 | MEDICATED_PAD | Freq: Once | CUTANEOUS | Status: DC

## 2019-11-04 MED ORDER — LIDOCAINE HCL 1 % IJ SOLN
INTRAMUSCULAR | Status: DC | PRN
  Administered 2019-11-04: 30 mL
  Administered 2019-11-04: 10 mL

## 2019-11-04 MED ORDER — OXYCODONE HCL 5 MG PO TABS: 5.0000 mg | ORAL_TABLET | Freq: Once | ORAL | Status: DC | PRN

## 2019-11-04 MED ORDER — SUGAMMADEX SODIUM 200 MG/2ML IV SOLN
INTRAVENOUS | Status: DC | PRN
  Administered 2019-11-04: 80 mg via INTRAVENOUS

## 2019-11-04 MED ORDER — TRAMADOL HCL 50 MG PO TABS: 50.0000 mg | ORAL_TABLET | Freq: Four times a day (QID) | ORAL | 0 refills | Status: DC | PRN

## 2019-11-04 MED ORDER — PROPOFOL 10 MG/ML IV BOLUS
INTRAVENOUS | Status: DC | PRN
  Administered 2019-11-04: 100 mg via INTRAVENOUS
  Administered 2019-11-04: 20 mg via INTRAVENOUS

## 2019-11-04 MED ORDER — BUPIVACAINE HCL (PF) 0.25 % IJ SOLN
INTRAMUSCULAR | Status: AC
  Filled 2019-11-04: qty 30

## 2019-11-04 MED ORDER — OXYCODONE HCL 5 MG/5ML PO SOLN: 5.0000 mg | Freq: Once | ORAL | Status: DC | PRN

## 2019-11-04 MED ORDER — ORAL CARE MOUTH RINSE: 15.0000 mL | Freq: Once | OROMUCOSAL | Status: AC

## 2019-11-04 MED ORDER — FENTANYL CITRATE (PF) 250 MCG/5ML IJ SOLN
INTRAMUSCULAR | Status: AC
  Filled 2019-11-04: qty 5

## 2019-11-04 MED ORDER — FENTANYL CITRATE (PF) 100 MCG/2ML IJ SOLN
INTRAMUSCULAR | Status: DC | PRN
  Administered 2019-11-04: 50 ug via INTRAVENOUS

## 2019-11-04 MED ORDER — CHLORHEXIDINE GLUCONATE 0.12 % MT SOLN
15.0000 mL | Freq: Once | OROMUCOSAL | Status: AC
  Administered 2019-11-04: 15 mL via OROMUCOSAL
  Filled 2019-11-04: qty 15

## 2019-11-04 MED ORDER — TECHNETIUM TC 99M SULFUR COLLOID FILTERED
0.5000 | Freq: Once | INTRAVENOUS | Status: AC | PRN
  Administered 2019-11-04: 0.5 via INTRADERMAL

## 2019-11-04 MED ORDER — METHYLENE BLUE 0.5 % INJ SOLN
INTRAVENOUS | Status: AC
  Filled 2019-11-04: qty 10

## 2019-11-04 MED ORDER — FENTANYL CITRATE (PF) 100 MCG/2ML IJ SOLN
INTRAMUSCULAR | Status: AC
  Filled 2019-11-04: qty 2

## 2019-11-04 MED ORDER — PROPOFOL 10 MG/ML IV BOLUS
INTRAVENOUS | Status: AC
  Filled 2019-11-04: qty 20

## 2019-11-04 MED ORDER — METHYLENE BLUE 1 % INJ SOLN
INTRAMUSCULAR | Status: DC | PRN
  Administered 2019-11-04: 3 mL via SUBMUCOSAL

## 2019-11-04 MED ORDER — PHENYLEPHRINE 40 MCG/ML (10ML) SYRINGE FOR IV PUSH (FOR BLOOD PRESSURE SUPPORT)
PREFILLED_SYRINGE | INTRAVENOUS | Status: AC
  Filled 2019-11-04: qty 10

## 2019-11-04 MED ORDER — LIDOCAINE HCL 1 % IJ SOLN
INTRAMUSCULAR | Status: AC
  Filled 2019-11-04: qty 20

## 2019-11-04 MED ORDER — EPHEDRINE SULFATE-NACL 50-0.9 MG/10ML-% IV SOSY
PREFILLED_SYRINGE | INTRAVENOUS | Status: DC | PRN
  Administered 2019-11-04: 10 mg via INTRAVENOUS
  Administered 2019-11-04 (×6): 5 mg via INTRAVENOUS

## 2019-11-04 MED ORDER — LACTATED RINGERS IV SOLN: INTRAVENOUS | Status: DC

## 2019-11-04 SURGICAL SUPPLY — 60 items
BENZOIN TINCTURE PRP APPL 2/3 (GAUZE/BANDAGES/DRESSINGS) ×4 IMPLANT
BLADE SURG 10 STRL SS (BLADE) ×4 IMPLANT
BNDG COHESIVE 4X5 TAN STRL (GAUZE/BANDAGES/DRESSINGS) IMPLANT
BNDG GAUZE ELAST 4 BULKY (GAUZE/BANDAGES/DRESSINGS) IMPLANT
CANISTER SUCT 3000ML PPV (MISCELLANEOUS) ×4 IMPLANT
CHLORAPREP W/TINT 26 (MISCELLANEOUS) ×4 IMPLANT
CLIP VESOCCLUDE MED 24/CT (CLIP) ×4 IMPLANT
CLIP VESOCCLUDE SM WIDE 24/CT (CLIP) ×4 IMPLANT
CLOSURE WOUND 1/2 X4 (GAUZE/BANDAGES/DRESSINGS) ×1
CNTNR URN SCR LID CUP LEK RST (MISCELLANEOUS) ×6 IMPLANT
CONT SPEC 4OZ STRL OR WHT (MISCELLANEOUS) ×12
COVER MAYO STAND STRL (DRAPES) ×4 IMPLANT
COVER PROBE W GEL 5X96 (DRAPES) ×4 IMPLANT
COVER SURGICAL LIGHT HANDLE (MISCELLANEOUS) ×4 IMPLANT
COVER WAND RF STERILE (DRAPES) ×4 IMPLANT
DECANTER SPIKE VIAL GLASS SM (MISCELLANEOUS) ×4 IMPLANT
DERMABOND ADVANCED (GAUZE/BANDAGES/DRESSINGS) ×2
DERMABOND ADVANCED .7 DNX12 (GAUZE/BANDAGES/DRESSINGS) ×2 IMPLANT
DRAPE HALF SHEET 40X57 (DRAPES) ×4 IMPLANT
DRAPE LAPAROSCOPIC ABDOMINAL (DRAPES) ×4 IMPLANT
DRSG TEGADERM 4X4.75 (GAUZE/BANDAGES/DRESSINGS) ×8 IMPLANT
ELECT REM PT RETURN 9FT ADLT (ELECTROSURGICAL) ×4
ELECTRODE REM PT RTRN 9FT ADLT (ELECTROSURGICAL) ×2 IMPLANT
GAUZE SPONGE 2X2 8PLY STRL LF (GAUZE/BANDAGES/DRESSINGS) ×2 IMPLANT
GAUZE SPONGE 4X4 12PLY STRL (GAUZE/BANDAGES/DRESSINGS) ×4 IMPLANT
GLOVE BIO SURGEON STRL SZ 6 (GLOVE) ×4 IMPLANT
GLOVE INDICATOR 6.5 STRL GRN (GLOVE) ×4 IMPLANT
GOWN STRL REUS W/ TWL LRG LVL3 (GOWN DISPOSABLE) ×6 IMPLANT
GOWN STRL REUS W/TWL 2XL LVL3 (GOWN DISPOSABLE) ×8 IMPLANT
GOWN STRL REUS W/TWL LRG LVL3 (GOWN DISPOSABLE) ×12
KIT BASIN OR (CUSTOM PROCEDURE TRAY) ×4 IMPLANT
KIT TURNOVER KIT B (KITS) ×4 IMPLANT
MARKER SKIN DUAL TIP RULER LAB (MISCELLANEOUS) ×4 IMPLANT
NEEDLE 18GX1X1/2 (RX/OR ONLY) (NEEDLE) ×4 IMPLANT
NEEDLE 22X1 1/2 (OR ONLY) (NEEDLE) ×4 IMPLANT
NEEDLE FILTER BLUNT 18X 1/2SAF (NEEDLE) ×2
NEEDLE FILTER BLUNT 18X1 1/2 (NEEDLE) ×2 IMPLANT
NEEDLE HYPO 25GX1X1/2 BEV (NEEDLE) ×4 IMPLANT
NS IRRIG 1000ML POUR BTL (IV SOLUTION) ×4 IMPLANT
PACK GENERAL/GYN (CUSTOM PROCEDURE TRAY) ×4 IMPLANT
PACK UNIVERSAL I (CUSTOM PROCEDURE TRAY) ×4 IMPLANT
PAD ARMBOARD 7.5X6 YLW CONV (MISCELLANEOUS) ×8 IMPLANT
PENCIL SMOKE EVACUATOR (MISCELLANEOUS) ×4 IMPLANT
SPECIMEN JAR MEDIUM (MISCELLANEOUS) ×4 IMPLANT
SPONGE GAUZE 2X2 STER 10/PKG (GAUZE/BANDAGES/DRESSINGS) ×2
STOCKINETTE IMPERVIOUS 9X36 MD (GAUZE/BANDAGES/DRESSINGS) ×4 IMPLANT
STRIP CLOSURE SKIN 1/2X4 (GAUZE/BANDAGES/DRESSINGS) ×3 IMPLANT
SUT ETHILON 2 0 FS 18 (SUTURE) ×8 IMPLANT
SUT ETHILON 2 0 PSLX (SUTURE) ×4 IMPLANT
SUT MNCRL AB 4-0 PS2 18 (SUTURE) ×8 IMPLANT
SUT SILK 2 0 PERMA HAND 18 BK (SUTURE) ×4 IMPLANT
SUT VIC AB 2-0 SH 27 (SUTURE) ×8
SUT VIC AB 2-0 SH 27XBRD (SUTURE) ×4 IMPLANT
SUT VIC AB 3-0 SH 27 (SUTURE) ×8
SUT VIC AB 3-0 SH 27X BRD (SUTURE) ×4 IMPLANT
SUT VIC AB 3-0 SH 8-18 (SUTURE) ×4 IMPLANT
SYR BULB EAR ULCER 3OZ GRN STR (SYRINGE) ×4 IMPLANT
SYR CONTROL 10ML LL (SYRINGE) ×8 IMPLANT
TOWEL GREEN STERILE (TOWEL DISPOSABLE) ×4 IMPLANT
TOWEL GREEN STERILE FF (TOWEL DISPOSABLE) ×4 IMPLANT

## 2019-11-04 NOTE — Transfer of Care (Signed)
Immediate Anesthesia Transfer of Care Note  Patient: Brandy Miranda  Procedure(s) Performed: WIDE LOCAL EXCISION  LEFT UPPER BACK MELANOMA WITH ADVANCEMENT FLAP CLOSURE (Left Back) SENTINEL NODE BIOPSY (Left Axilla)  Patient Location: PACU  Anesthesia Type:General  Level of Consciousness: awake, alert  and patient cooperative  Airway & Oxygen Therapy: Patient Spontanous Breathing and Patient connected to nasal cannula oxygen  Post-op Assessment: Report given to RN and Post -op Vital signs reviewed and stable  Post vital signs: Reviewed and stable  Last Vitals:  Vitals Value Taken Time  BP 145/57 11/04/19 1105  Temp    Pulse 87 11/04/19 1106  Resp 20 11/04/19 1106  SpO2 100 % 11/04/19 1106  Vitals shown include unvalidated device data.  Last Pain:  Vitals:   11/04/19 0727  TempSrc: Oral  PainSc:          Complications: No complications documented.

## 2019-11-04 NOTE — Op Note (Signed)
PRE-OPERATIVE DIAGNOSIS: [id number] left back melanoma  POST-OPERATIVE DIAGNOSIS:  Same  PROCEDURE:  Procedure(s): Wide local excision 1-2 cm margins, advancement flap closure for defect 12 cm x 6 cm, left axillary sentinel lymph node mapping and biopsy  SURGEON:  Surgeon(s): [name], MD  ASSIST:  Reece Leader, MD; [email or web address].Donald  ANESTHESIA:   local and general  DRAINS: none   LOCAL MEDICATIONS USED:  MARCAINE    and XYLOCAINE   SPECIMEN:  Source of Specimen:  Left axillary sentinel lymph node, wide local excision left back melanoma   FINDINGS:  Some residual pigment on back.  DISPOSITION OF SPECIMEN:  PATHOLOGY  COUNTS:  YES  PLAN OF CARE: Discharge to home after PACU  PATIENT DISPOSITION:  PACU - hemodynamically stable.    PROCEDURE:   Pt was identified in the holding area, taken to the OR, and placed supine on the OR table.  General anesthesia was induced.  Time out was performed according to the surgical safety checklist.  When all was correct, we continued.  Two mL methylene blue was injected intradermally around the melanoma biopsy site.    The patient was placed into the right lateral decubitus position.  The left back was prepped and draped in sterile fashion.  The melanoma was identified and 1-2 cm margins were marked out.  Local was administered under the melanoma and the adjacent tissue.  A #10 blade was used to incise the skin around the melanoma.  The cautery was used to take the dissection down to the fascia.  The skin was marked in situ with orientation sutures.  The cautery was used to take the specimen off the fascia, and it was passed off the table.    Skin hooks were used to elevate the edges of the incision and the skin was freed up in all directions with the cautery to create advancement flaps.  This was pulled together in a transverse orientation. The skin was pulled together to check the tension. . Deep interrupted 2-0 vicryl sutures were placed to relieve  tension.  The skin was then reapproximated with 3-0 interrupted vicryl deep dermal sutures and 4-0 monocryl running subcuticular sutures.  Three 2-0 nylon horizontal mattress sutures were placed as well.  This was dressed with benzoin, steristrips, gauze, and tegaderm.    The patient was flipped back supine.  The patient's left axilla and upper chest were prepped and draped in sterile fashion.  The point of maximum signal intensity was identified with the neoprobe.  A 4 cm incision was made with a #15 blade.  The subcutaneous tissues were divided with the cautery.  A Weitlaner retractor was used to assist with visualization.  The tonsil clamp was used to bluntly dissect the axillary fat pad.  One deep axillary level 2 sentinel lymph nodes were identified as described above.  The lymphovascular channels were clipped with hemoclips.  The nodes were passed off as specimens.  Hemostasis was achieved with the cautery.  The axilla was irrigated and closed with 3-0 Vicryl deep dermal interrupted sutures and 4-0 Monocryl running subcuticular suture.  The axilla was dressed with dermabond.    Needle, sponge, and instrument counts were correct.  The patient was awakened from anesthesia and taken to the PACU in stable condition.

## 2019-11-04 NOTE — Interval H&P Note (Signed)
History and Physical Interval Note:  11/04/2019 8:56 AM  Brandy Miranda  has presented today for surgery, with the diagnosis of LEFT UPPER BACK MELANOMA.  The various methods of treatment have been discussed with the patient and family. After consideration of risks, benefits and other options for treatment, the patient has consented to  Procedure(s): WIDE LOCAL EXCISION  LEFT UPPER BACK MELANOMA WITH ADVANCEMENT FLAP CLOSURE,  SENTINEL LYMPH NODE BIOPSY (Left) as a surgical intervention.  The patient's history has been reviewed, patient examined, no change in status, stable for surgery.  I have reviewed the patient's chart and labs.  Questions were answered to the patient's satisfaction.     [name]

## 2019-11-04 NOTE — Anesthesia Postprocedure Evaluation (Signed)
Anesthesia Post Note  Patient: HANLEY RISPOLI  Procedure(s) Performed: WIDE LOCAL EXCISION  LEFT UPPER BACK MELANOMA WITH ADVANCEMENT FLAP CLOSURE (Left Back) SENTINEL NODE BIOPSY (Left Axilla)     Patient location during evaluation: PACU Anesthesia Type: General Level of consciousness: awake and alert and oriented Pain management: pain level controlled Vital Signs Assessment: post-procedure vital signs reviewed and stable Respiratory status: spontaneous breathing, nonlabored ventilation and respiratory function stable Cardiovascular status: blood pressure returned to baseline Postop Assessment: no apparent nausea or vomiting Anesthetic complications: no   No complications documented.  Last Vitals:  Vitals:   11/04/19 1120 11/04/19 1130  BP: (!) 135/55 (!) 134/57  Pulse: 82 79  Resp: 19 18  Temp:  (!) 36.2 C  SpO2: 97% 98%    Last Pain:  Vitals:   11/04/19 1130  TempSrc:   PainSc: 0-No pain                 Brennan Bailey

## 2019-11-04 NOTE — Discharge Instructions (Addendum)
[place] Office Phone Number [phone]   POST OP INSTRUCTIONS  Always review your discharge instruction sheet given to you by the facility where your surgery was performed.  IF YOU HAVE DISABILITY OR FAMILY LEAVE FORMS, YOU MUST BRING THEM TO THE OFFICE FOR PROCESSING.  DO NOT GIVE THEM TO YOUR DOCTOR.  1. A prescription for pain medication may be given to you upon discharge.  Take your pain medication as prescribed, if needed.  If narcotic pain medicine is not needed, then you may take acetaminophen (Tylenol) or ibuprofen (Advil) as needed. 2. Take your usually prescribed medications unless otherwise directed 3. If you need a refill on your pain medication, please contact your pharmacy.  They will contact our office to request authorization.  Prescriptions will not be filled after 5pm or on week-ends. 4. You should eat very light the first 24 hours after surgery, such as soup, crackers, pudding, etc.  Resume your normal diet the day after surgery 5. It is common to experience some constipation if taking pain medication after surgery.  Increasing fluid intake and taking a stool softener will usually help or prevent this problem from occurring.  A mild laxative (Milk of Magnesia or Miralax) should be taken according to package directions if there are no bowel movements after 48 hours. 6. You may shower in 48 hours.  Ok to remove dressing before shower.  The surgical glue in the axilla will flake off in 2-3 weeks.   7. ACTIVITIES:  No strenuous activity or heavy lifting for 2 week.   a. You may drive when you no longer are taking prescription pain medication, you can comfortably wear a seatbelt, and you can safely maneuver your car and apply brakes. b. RETURN TO WORK:  __________n/a_______________ Dennis Bast should see your doctor in the office for a follow-up appointment approximately three-four weeks after your surgery.    WHEN TO CALL YOUR DOCTOR: 1. Fever over 101.0 2. Nausea  and/or vomiting. 3. Extreme swelling or bruising. 4. Continued bleeding from incision. 5. Increased pain, redness, or drainage from the incision.  The clinic staff is available to answer your questions during regular business hours.  Please don't hesitate to call and ask to speak to one of the nurses for clinical concerns.  If you have a medical emergency, go to the nearest emergency room or call 911.  A surgeon from [facility] is always on call at the hospital.  For further questions, please visit [email or web address]

## 2019-11-05 ENCOUNTER — Encounter (HOSPITAL_COMMUNITY): Payer: Self-pay | Admitting: General Surgery

## 2019-11-12 LAB — SURGICAL PATHOLOGY

## 2019-11-13 ENCOUNTER — Telehealth: Payer: Self-pay | Admitting: General Surgery

## 2019-11-13 NOTE — Telephone Encounter (Signed)
Discussed pathology with daughter.  Node was positive.  Will get PET and refer to Dr. Harriet Masson at patient request.

## 2019-11-18 DIAGNOSIS — C4359 Malignant melanoma of other part of trunk: Principal | ICD-10-CM

## 2019-11-23 ENCOUNTER — Other Ambulatory Visit (HOSPITAL_COMMUNITY): Payer: Self-pay | Admitting: General Surgery

## 2019-11-23 ENCOUNTER — Other Ambulatory Visit: Payer: Self-pay | Admitting: General Surgery

## 2019-11-23 DIAGNOSIS — C4359 Malignant melanoma of other part of trunk: Secondary | ICD-10-CM

## 2019-11-27 ENCOUNTER — Other Ambulatory Visit: Payer: Self-pay

## 2019-11-27 ENCOUNTER — Ambulatory Visit (HOSPITAL_COMMUNITY)
Admission: RE | Admit: 2019-11-27 | Discharge: 2019-11-27 | Disposition: A | Discharge status: Home / Self Care | Payer: Medicare Other | Source: Ambulatory Visit | Attending: General Surgery | Admitting: General Surgery

## 2019-11-27 DIAGNOSIS — C4359 Malignant melanoma of other part of trunk: Secondary | ICD-10-CM | POA: Insufficient documentation

## 2019-11-27 DIAGNOSIS — E041 Nontoxic single thyroid nodule: Secondary | ICD-10-CM | POA: Diagnosis not present

## 2019-11-27 LAB — GLUCOSE, CAPILLARY: Glucose-Capillary: 95 mg/dL (ref 70–99)

## 2019-11-27 MED ORDER — FLUDEOXYGLUCOSE F - 18 (FDG) INJECTION
8.0000 | Freq: Once | INTRAVENOUS | Status: AC | PRN
  Administered 2019-11-27: 8 via INTRAVENOUS

## 2019-11-30 ENCOUNTER — Ambulatory Visit: Admit: 2019-11-30 | Discharge: 2019-12-01 | Payer: MEDICARE

## 2019-11-30 DIAGNOSIS — C4359 Malignant melanoma of other part of trunk: Principal | ICD-10-CM

## 2019-12-01 ENCOUNTER — Other Ambulatory Visit: Payer: Self-pay | Admitting: General Surgery

## 2019-12-01 ENCOUNTER — Ambulatory Visit
Admission: RE | Admit: 2019-12-01 | Discharge: 2019-12-01 | Disposition: A | Discharge status: Home / Self Care | Payer: Medicare Other | Source: Ambulatory Visit | Attending: General Surgery | Admitting: General Surgery

## 2019-12-01 DIAGNOSIS — E041 Nontoxic single thyroid nodule: Secondary | ICD-10-CM

## 2019-12-02 ENCOUNTER — Other Ambulatory Visit: Payer: Self-pay | Admitting: General Surgery

## 2019-12-02 DIAGNOSIS — E041 Nontoxic single thyroid nodule: Secondary | ICD-10-CM

## 2019-12-03 ENCOUNTER — Encounter
Admit: 2019-12-03 | Discharge: 2019-12-04 | Payer: MEDICARE | Attending: Hematology & Oncology | Primary: Hematology & Oncology

## 2019-12-03 DIAGNOSIS — C4359 Malignant melanoma of other part of trunk: Principal | ICD-10-CM

## 2019-12-07 ENCOUNTER — Encounter
Admit: 2019-12-07 | Discharge: 2019-12-07 | Payer: MEDICARE | Attending: Hematology & Oncology | Primary: Hematology & Oncology

## 2019-12-07 DIAGNOSIS — D485 Neoplasm of uncertain behavior of skin: Principal | ICD-10-CM

## 2019-12-16 ENCOUNTER — Ambulatory Visit
Admission: RE | Admit: 2019-12-16 | Discharge: 2019-12-16 | Disposition: A | Discharge status: Home / Self Care | Payer: Medicare Other | Source: Ambulatory Visit | Attending: General Surgery | Admitting: General Surgery

## 2019-12-16 ENCOUNTER — Other Ambulatory Visit (HOSPITAL_COMMUNITY)
Admission: RE | Admit: 2019-12-16 | Discharge: 2019-12-16 | Disposition: A | Discharge status: Home / Self Care | Payer: Medicare Other | Source: Ambulatory Visit | Attending: General Surgery | Admitting: General Surgery

## 2019-12-16 ENCOUNTER — Other Ambulatory Visit: Payer: Self-pay

## 2019-12-16 DIAGNOSIS — E041 Nontoxic single thyroid nodule: Secondary | ICD-10-CM

## 2019-12-16 NOTE — Procedures (Signed)
PROCEDURE SUMMARY:  Using direct ultrasound guidance, 5 passes were made into the nodule within the right mid lobe of the thyroid and  5 passes were made  into the nodule within the left superior lobe of the thyroid using 25 g needles.  Ultrasound was used to confirm needle placements on all occasions.   EBL = trace  Specimens were sent to Pathology for analysis.  See procedure note under Imaging tab in Epic for full procedure details.  Judie Grieve Roxie Gueye PA-C 12/16/2019 3:38 PM

## 2019-12-17 LAB — CYTOLOGY - NON PAP

## 2019-12-18 ENCOUNTER — Encounter
Admit: 2019-12-18 | Discharge: 2019-12-18 | Payer: MEDICARE | Attending: Hematology & Oncology | Primary: Hematology & Oncology

## 2019-12-18 DIAGNOSIS — D485 Neoplasm of uncertain behavior of skin: Principal | ICD-10-CM

## 2019-12-29 ENCOUNTER — Encounter: Admit: 2019-12-29 | Discharge: 2019-12-30 | Payer: MEDICARE

## 2019-12-29 ENCOUNTER — Encounter
Admit: 2019-12-29 | Discharge: 2019-12-30 | Payer: MEDICARE | Attending: Hematology & Oncology | Primary: Hematology & Oncology

## 2019-12-29 ENCOUNTER — Ambulatory Visit: Admit: 2019-12-29 | Discharge: 2019-12-30 | Payer: MEDICARE

## 2019-12-29 DIAGNOSIS — C4359 Malignant melanoma of other part of trunk: Secondary | ICD-10-CM | POA: Diagnosis present

## 2019-12-29 DIAGNOSIS — Z79899 Other long term (current) drug therapy: Principal | ICD-10-CM

## 2020-01-06 ENCOUNTER — Other Ambulatory Visit: Payer: Self-pay | Admitting: General Surgery

## 2020-01-06 DIAGNOSIS — E041 Nontoxic single thyroid nodule: Secondary | ICD-10-CM

## 2020-01-12 ENCOUNTER — Other Ambulatory Visit: Payer: Medicare Other

## 2020-01-26 ENCOUNTER — Other Ambulatory Visit (HOSPITAL_COMMUNITY)
Admission: RE | Admit: 2020-01-26 | Discharge: 2020-01-26 | Disposition: A | Discharge status: Home / Self Care | Payer: Medicare Other | Source: Ambulatory Visit | Attending: Radiology | Admitting: Radiology

## 2020-01-26 ENCOUNTER — Ambulatory Visit
Admission: RE | Admit: 2020-01-26 | Discharge: 2020-01-26 | Disposition: A | Discharge status: Home / Self Care | Payer: Medicare Other | Source: Ambulatory Visit | Attending: General Surgery | Admitting: General Surgery

## 2020-01-26 DIAGNOSIS — E041 Nontoxic single thyroid nodule: Secondary | ICD-10-CM | POA: Insufficient documentation

## 2020-01-27 DIAGNOSIS — C4359 Malignant melanoma of other part of trunk: Principal | ICD-10-CM

## 2020-01-29 LAB — CYTOLOGY - NON PAP

## 2020-02-01 ENCOUNTER — Encounter: Admit: 2020-02-01 | Discharge: 2020-02-01 | Payer: MEDICARE

## 2020-02-01 ENCOUNTER — Ambulatory Visit: Admit: 2020-02-01 | Discharge: 2020-02-01 | Payer: MEDICARE

## 2020-02-01 DIAGNOSIS — Z79899 Other long term (current) drug therapy: Principal | ICD-10-CM

## 2020-02-01 DIAGNOSIS — E042 Nontoxic multinodular goiter: Principal | ICD-10-CM

## 2020-02-01 DIAGNOSIS — K8020 Calculus of gallbladder without cholecystitis without obstruction: Principal | ICD-10-CM

## 2020-02-01 DIAGNOSIS — C4359 Malignant melanoma of other part of trunk: Principal | ICD-10-CM

## 2020-02-01 DIAGNOSIS — Z5112 Encounter for antineoplastic immunotherapy: Principal | ICD-10-CM

## 2020-02-08 DIAGNOSIS — Z79899 Other long term (current) drug therapy: Principal | ICD-10-CM

## 2020-02-08 DIAGNOSIS — C4359 Malignant melanoma of other part of trunk: Principal | ICD-10-CM

## 2020-02-19 ENCOUNTER — Encounter
Admit: 2020-02-19 | Discharge: 2020-02-19 | Payer: MEDICARE | Attending: Hematology & Oncology | Primary: Hematology & Oncology

## 2020-02-19 ENCOUNTER — Encounter: Admit: 2020-02-19 | Discharge: 2020-02-19 | Payer: MEDICARE

## 2020-02-19 DIAGNOSIS — C4359 Malignant melanoma of other part of trunk: Principal | ICD-10-CM

## 2020-02-19 DIAGNOSIS — Z5112 Encounter for antineoplastic immunotherapy: Principal | ICD-10-CM

## 2020-02-19 DIAGNOSIS — C799 Secondary malignant neoplasm of unspecified site: Principal | ICD-10-CM

## 2020-02-19 DIAGNOSIS — Z5111 Encounter for antineoplastic chemotherapy: Principal | ICD-10-CM

## 2020-02-19 DIAGNOSIS — Z79899 Other long term (current) drug therapy: Principal | ICD-10-CM

## 2020-03-15 DIAGNOSIS — Z79899 Other long term (current) drug therapy: Principal | ICD-10-CM

## 2020-03-15 DIAGNOSIS — C4359 Malignant melanoma of other part of trunk: Principal | ICD-10-CM

## 2020-03-22 ENCOUNTER — Encounter
Admit: 2020-03-22 | Discharge: 2020-03-23 | Payer: MEDICARE | Attending: Hematology & Oncology | Primary: Hematology & Oncology

## 2020-03-22 ENCOUNTER — Encounter: Admit: 2020-03-22 | Discharge: 2020-03-23 | Payer: MEDICARE

## 2020-03-22 DIAGNOSIS — Z79899 Other long term (current) drug therapy: Principal | ICD-10-CM

## 2020-03-22 DIAGNOSIS — C4359 Malignant melanoma of other part of trunk: Principal | ICD-10-CM

## 2020-03-24 ENCOUNTER — Encounter: Admit: 2020-03-24 | Discharge: 2020-03-24 | Payer: MEDICARE

## 2020-03-24 DIAGNOSIS — Z5112 Encounter for antineoplastic immunotherapy: Principal | ICD-10-CM

## 2020-03-24 DIAGNOSIS — C439 Malignant melanoma of skin, unspecified: Principal | ICD-10-CM

## 2020-03-24 DIAGNOSIS — C4359 Malignant melanoma of other part of trunk: Principal | ICD-10-CM

## 2020-03-24 DIAGNOSIS — Z79899 Other long term (current) drug therapy: Principal | ICD-10-CM

## 2020-04-22 DIAGNOSIS — C439 Malignant melanoma of skin, unspecified: Principal | ICD-10-CM

## 2020-04-25 ENCOUNTER — Ambulatory Visit: Admit: 2020-04-25 | Discharge: 2020-04-25 | Payer: MEDICARE

## 2020-04-25 ENCOUNTER — Encounter: Admit: 2020-04-25 | Discharge: 2020-04-25 | Payer: MEDICARE

## 2020-04-27 DIAGNOSIS — Z79899 Other long term (current) drug therapy: Principal | ICD-10-CM

## 2020-04-27 DIAGNOSIS — C4359 Malignant melanoma of other part of trunk: Principal | ICD-10-CM

## 2020-04-29 ENCOUNTER — Encounter: Admit: 2020-04-29 | Discharge: 2020-04-29 | Payer: MEDICARE

## 2020-04-29 ENCOUNTER — Encounter
Admit: 2020-04-29 | Discharge: 2020-04-29 | Payer: MEDICARE | Attending: Hematology & Oncology | Primary: Hematology & Oncology

## 2020-04-29 DIAGNOSIS — C4359 Malignant melanoma of other part of trunk: Principal | ICD-10-CM

## 2020-04-29 DIAGNOSIS — Z79899 Other long term (current) drug therapy: Principal | ICD-10-CM

## 2020-07-19 DIAGNOSIS — C439 Malignant melanoma of skin, unspecified: Principal | ICD-10-CM

## 2020-07-19 DIAGNOSIS — C434 Malignant melanoma of scalp and neck: Principal | ICD-10-CM

## 2020-07-29 ENCOUNTER — Non-Acute Institutional Stay: Admit: 2020-07-29 | Discharge: 2020-07-29 | Payer: MEDICARE

## 2020-07-29 ENCOUNTER — Encounter
Admit: 2020-07-29 | Discharge: 2020-07-29 | Payer: MEDICARE | Attending: Hematology & Oncology | Primary: Hematology & Oncology

## 2020-07-29 ENCOUNTER — Encounter: Admit: 2020-07-29 | Discharge: 2020-07-29 | Payer: MEDICARE

## 2020-07-29 DIAGNOSIS — C4359 Malignant melanoma of other part of trunk: Principal | ICD-10-CM

## 2020-09-01 ENCOUNTER — Encounter
Admit: 2020-09-01 | Discharge: 2020-09-01 | Payer: MEDICARE | Attending: Hematology & Oncology | Primary: Hematology & Oncology

## 2020-09-01 DIAGNOSIS — D485 Neoplasm of uncertain behavior of skin: Principal | ICD-10-CM

## 2020-11-04 ENCOUNTER — Other Ambulatory Visit: Admit: 2020-11-04 | Discharge: 2020-11-04 | Payer: MEDICARE

## 2020-11-04 ENCOUNTER — Ambulatory Visit: Admit: 2020-11-04 | Discharge: 2020-11-04 | Payer: MEDICARE

## 2020-11-04 ENCOUNTER — Ambulatory Visit
Admit: 2020-11-04 | Discharge: 2020-11-04 | Payer: MEDICARE | Attending: Hematology & Oncology | Primary: Hematology & Oncology

## 2020-11-04 DIAGNOSIS — C439 Malignant melanoma of skin, unspecified: Principal | ICD-10-CM

## 2020-11-04 DIAGNOSIS — C4359 Malignant melanoma of other part of trunk: Principal | ICD-10-CM

## 2021-01-06 ENCOUNTER — Other Ambulatory Visit: Payer: Self-pay

## 2021-01-06 ENCOUNTER — Observation Stay (HOSPITAL_COMMUNITY)
Admission: EM | Admit: 2021-01-06 | Discharge: 2021-01-08 | Disposition: A | Discharge status: Home / Self Care | Payer: Medicare Other | Attending: Internal Medicine | Admitting: Internal Medicine

## 2021-01-06 ENCOUNTER — Emergency Department (HOSPITAL_COMMUNITY): Payer: Medicare Other

## 2021-01-06 ENCOUNTER — Encounter (HOSPITAL_COMMUNITY): Payer: Self-pay | Admitting: Internal Medicine

## 2021-01-06 DIAGNOSIS — Z20822 Contact with and (suspected) exposure to covid-19: Secondary | ICD-10-CM | POA: Diagnosis not present

## 2021-01-06 DIAGNOSIS — C4359 Malignant melanoma of other part of trunk: Secondary | ICD-10-CM | POA: Diagnosis not present

## 2021-01-06 DIAGNOSIS — R55 Syncope and collapse: Secondary | ICD-10-CM | POA: Diagnosis present

## 2021-01-06 DIAGNOSIS — E876 Hypokalemia: Secondary | ICD-10-CM

## 2021-01-06 DIAGNOSIS — R262 Difficulty in walking, not elsewhere classified: Secondary | ICD-10-CM | POA: Diagnosis not present

## 2021-01-06 DIAGNOSIS — I10 Essential (primary) hypertension: Secondary | ICD-10-CM

## 2021-01-06 DIAGNOSIS — Z79899 Other long term (current) drug therapy: Secondary | ICD-10-CM | POA: Insufficient documentation

## 2021-01-06 DIAGNOSIS — D72829 Elevated white blood cell count, unspecified: Secondary | ICD-10-CM

## 2021-01-06 DIAGNOSIS — Z7982 Long term (current) use of aspirin: Secondary | ICD-10-CM | POA: Insufficient documentation

## 2021-01-06 DIAGNOSIS — I447 Left bundle-branch block, unspecified: Secondary | ICD-10-CM

## 2021-01-06 DIAGNOSIS — E785 Hyperlipidemia, unspecified: Secondary | ICD-10-CM

## 2021-01-06 DIAGNOSIS — Z85820 Personal history of malignant melanoma of skin: Secondary | ICD-10-CM | POA: Insufficient documentation

## 2021-01-06 LAB — BASIC METABOLIC PANEL
Anion gap: 6 (ref 5–15)
BUN: 22 mg/dL (ref 8–23)
CO2: 30 mmol/L (ref 22–32)
Calcium: 9.3 mg/dL (ref 8.9–10.3)
Chloride: 101 mmol/L (ref 98–111)
Creatinine, Ser: 0.82 mg/dL (ref 0.44–1.00)
GFR, Estimated: >60 mL/min (ref >60)
Glucose, Bld: 111 mg/dL — ABNORMAL HIGH (ref 70–99)
Potassium: 3.3 mmol/L — ABNORMAL LOW (ref 3.5–5.1)
Sodium: 137 mmol/L (ref 135–145)

## 2021-01-06 LAB — CBC WITH DIFFERENTIAL/PLATELET
Abs Immature Granulocytes: 0.11 10*3/uL — ABNORMAL HIGH (ref 0.00–0.07)
Basophils Absolute: 0 10*3/uL (ref 0.0–0.1)
Basophils Relative: 0 %
Eosinophils Absolute: 0 10*3/uL (ref 0.0–0.5)
Eosinophils Relative: 0 %
HCT: 37.3 % (ref 36.0–46.0)
Hemoglobin: 12.2 g/dL (ref 12.0–15.0)
Immature Granulocytes: 1 %
Lymphocytes Relative: 8 %
Lymphs Abs: 0.9 10*3/uL (ref 0.7–4.0)
MCH: 29.8 pg (ref 26.0–34.0)
MCHC: 32.7 g/dL (ref 30.0–36.0)
MCV: 91 fL (ref 80.0–100.0)
Monocytes Absolute: 0.7 10*3/uL (ref 0.1–1.0)
Monocytes Relative: 6 %
Neutro Abs: 9.6 10*3/uL — ABNORMAL HIGH (ref 1.7–7.7)
Neutrophils Relative %: 85 %
Platelets: 236 10*3/uL (ref 150–400)
RBC: 4.1 MIL/uL (ref 3.87–5.11)
RDW: 13.2 % (ref 11.5–15.5)
WBC: 11.3 10*3/uL — ABNORMAL HIGH (ref 4.0–10.5)
nRBC: 0 % (ref 0.0–0.2)

## 2021-01-06 LAB — URINALYSIS, ROUTINE W REFLEX MICROSCOPIC
Bilirubin Urine: NEGATIVE
Glucose, UA: NEGATIVE mg/dL
Hgb urine dipstick: NEGATIVE
Ketones, ur: NEGATIVE mg/dL
Leukocytes,Ua: NEGATIVE
Nitrite: NEGATIVE
Protein, ur: NEGATIVE mg/dL
Specific Gravity, Urine: 1.017 (ref 1.005–1.030)
pH: 5 (ref 5.0–8.0)

## 2021-01-06 LAB — TROPONIN I (HIGH SENSITIVITY)
Troponin I (High Sensitivity): 5 ng/L (ref <18)
Troponin I (High Sensitivity): 5 ng/L (ref <18)

## 2021-01-06 LAB — MAGNESIUM: Magnesium: 1.6 mg/dL — ABNORMAL LOW (ref 1.7–2.4)

## 2021-01-06 LAB — RESP PANEL BY RT-PCR (FLU A&B, COVID) ARPGX2
Influenza A by PCR: NEGATIVE
Influenza B by PCR: NEGATIVE
SARS Coronavirus 2 by RT PCR: NEGATIVE

## 2021-01-06 LAB — CBG MONITORING, ED: Glucose-Capillary: 115 mg/dL — ABNORMAL HIGH (ref 70–99)

## 2021-01-06 MED ORDER — ACETAMINOPHEN 325 MG PO TABS: 650.0000 mg | ORAL_TABLET | Freq: Four times a day (QID) | ORAL | Status: DC | PRN

## 2021-01-06 MED ORDER — ATORVASTATIN CALCIUM 10 MG PO TABS
20.0000 mg | ORAL_TABLET | Freq: Every day | ORAL | Status: DC
  Administered 2021-01-07 – 2021-01-08 (×2): 20 mg via ORAL
  Filled 2021-01-06 (×2): qty 2

## 2021-01-06 MED ORDER — OLOPATADINE HCL 0.1 % OP SOLN
1.0000 [drp] | Freq: Two times a day (BID) | OPHTHALMIC | Status: DC
  Administered 2021-01-06 – 2021-01-08 (×4): 1 [drp] via OPHTHALMIC
  Filled 2021-01-06: qty 5

## 2021-01-06 MED ORDER — AMLODIPINE BESYLATE 5 MG PO TABS: 10.0000 mg | ORAL_TABLET | Freq: Every day | ORAL | Status: DC

## 2021-01-06 MED ORDER — ENOXAPARIN SODIUM 40 MG/0.4ML IJ SOSY
40.0000 mg | PREFILLED_SYRINGE | INTRAMUSCULAR | Status: DC
  Administered 2021-01-06 – 2021-01-07 (×2): 40 mg via SUBCUTANEOUS
  Filled 2021-01-06 (×2): qty 0.4

## 2021-01-06 MED ORDER — POTASSIUM CHLORIDE CRYS ER 20 MEQ PO TBCR
40.0000 meq | EXTENDED_RELEASE_TABLET | Freq: Once | ORAL | Status: AC
  Administered 2021-01-06: 40 meq via ORAL
  Filled 2021-01-06: qty 2

## 2021-01-06 MED ORDER — ACETAMINOPHEN 650 MG RE SUPP: 650.0000 mg | Freq: Four times a day (QID) | RECTAL | Status: DC | PRN

## 2021-01-06 MED ORDER — IRBESARTAN 300 MG PO TABS: 300.0000 mg | ORAL_TABLET | Freq: Every day | ORAL | Status: DC

## 2021-01-06 MED ORDER — HYDROCHLOROTHIAZIDE 25 MG PO TABS
25.0000 mg | ORAL_TABLET | Freq: Every day | ORAL | Status: DC
  Administered 2021-01-07 – 2021-01-08 (×2): 25 mg via ORAL
  Filled 2021-01-06 (×2): qty 1

## 2021-01-06 MED ORDER — SODIUM CHLORIDE 0.9 % IV BOLUS
500.0000 mL | Freq: Once | INTRAVENOUS | Status: AC
  Administered 2021-01-06: 500 mL via INTRAVENOUS

## 2021-01-06 MED ORDER — MAGNESIUM SULFATE 2 GM/50ML IV SOLN
2.0000 g | Freq: Once | INTRAVENOUS | Status: AC
  Administered 2021-01-06: 2 g via INTRAVENOUS
  Filled 2021-01-06: qty 50

## 2021-01-06 MED ORDER — SODIUM CHLORIDE 0.9% FLUSH
3.0000 mL | Freq: Two times a day (BID) | INTRAVENOUS | Status: DC
  Administered 2021-01-06 – 2021-01-08 (×4): 3 mL via INTRAVENOUS

## 2021-01-06 MED ORDER — IRBESARTAN 150 MG PO TABS
300.0000 mg | ORAL_TABLET | Freq: Every day | ORAL | Status: DC
  Administered 2021-01-06 – 2021-01-07 (×2): 300 mg via ORAL
  Filled 2021-01-06 (×2): qty 2

## 2021-01-06 MED ORDER — AMLODIPINE BESYLATE 10 MG PO TABS
10.0000 mg | ORAL_TABLET | Freq: Every day | ORAL | Status: DC
  Administered 2021-01-06 – 2021-01-07 (×2): 10 mg via ORAL
  Filled 2021-01-06 (×2): qty 1

## 2021-01-06 NOTE — ED Notes (Signed)
Pt daughter is at the bedside. She was in contact with PCP and wanted to ensure we are aware that Ms. Dralle had a surgery this September for her melanoma. Provider is concerned about potential spread/effects on mental status. Daughter reports "she has had a lot of spots removed on her back over the last year"

## 2021-01-06 NOTE — ED Provider Notes (Signed)
[facility] EMERGENCY DEPARTMENT Provider Note   CSN: [id number] Arrival date & time: 01/06/21  1207     History Chief Complaint  Patient presents with   Loss of Consciousness    Brandy Miranda is a 87 y.o. female.  HPI     Past Medical History:  Diagnosis Date   Cancer ([place]) 2021   Melanoma, back   HOH (hard of hearing)    Wears bilateral hearing aids   Hypercholesteremia    Hypertension     Patient Active Problem List   Diagnosis Date Noted   Syncope and collapse 01/06/2021   HTN (hypertension) 01/06/2021   HLD (hyperlipidemia) 01/06/2021   Hypokalemia 01/06/2021   Hypomagnesemia 01/06/2021   LBBB (left bundle branch block) 01/06/2021   Leukocytosis 01/06/2021   Malignant melanoma of lower back ([place]) 12/29/2019    Past Surgical History:  Procedure Laterality Date   MELANOMA EXCISION WITH SENTINEL LYMPH NODE BIOPSY Left 11/04/2019   Procedure: WIDE LOCAL EXCISION  LEFT UPPER BACK MELANOMA WITH ADVANCEMENT FLAP CLOSURE;  Surgeon: [name], MD;  Location: [place];  Service: General;  Laterality: Left;   SENTINEL NODE BIOPSY Left 11/04/2019   Procedure: SENTINEL NODE BIOPSY;  Surgeon: [name], MD;  Location: [place];  Service: General;  Laterality: Left;     OB History   No obstetric history on file.     Family History  Problem Relation Age of Onset   Hypertension Mother     Social History   Tobacco Use   Smoking status: Never   Smokeless tobacco: Never  Vaping Use   Vaping Use: Never used  Substance Use Topics   Alcohol use: Yes    Comment: occ   Drug use: Never    Home Medications Prior to Admission medications   Medication Sig Start Date End Date Taking? Authorizing Provider  amLODipine (NORVASC) 10 MG tablet Take 10 mg by mouth daily. 06/30/19  Yes [provider]  Ascorbic Acid (VITAMIN C PO) Take 2 tablets by mouth daily.   Yes [provider]  aspirin EC 81 MG tablet Take 81 mg by mouth every  Sunday.   Yes [provider]  atorvastatin (LIPITOR) 20 MG tablet Take 20 mg by mouth daily. 02/10/18  Yes [provider]  hydrALAZINE (APRESOLINE) 10 MG tablet Take 10 mg by mouth daily as needed (if Systolic B/P is 035 or greater).   Yes [provider]  hydrochlorothiazide (HYDRODIURIL) 25 MG tablet Take 25 mg by mouth daily. 01/20/18  Yes [provider]  olmesartan (BENICAR) 40 MG tablet Take 40 mg by mouth daily. 01/13/18  Yes [provider]  Olopatadine HCl (PATADAY OP) Place 1 drop into both eyes in the morning and at bedtime.   Yes [provider]  OVER THE COUNTER MEDICATION Take 3 tablets by mouth daily. DoTerra - bone nutrient supplement   Yes [provider]  OVER THE COUNTER MEDICATION Take 1 tablet by mouth daily. DoTerra supplement - omega's, etc.    Yes [provider]  OVER THE COUNTER MEDICATION Take 1 tablet by mouth daily. DoTerra supplement - alpha crs   Yes [provider]  OVER THE COUNTER MEDICATION Take 1 capsule by mouth daily. DoTerra - microplex   Yes [provider]  traMADol (ULTRAM) 50 MG tablet Take 1 tablet (50 mg total) by mouth every 6 (six) hours as needed for moderate pain or severe pain. Patient not taking: Reported on 01/06/2021  11/04/19   [name], MD    Allergies    Patient has no known allergies.  Review of Systems   Review of Systems  Constitutional:  Negative for chills and fever.  HENT:  Negative for ear pain and sore throat.   Eyes:  Negative for pain and visual disturbance.  Respiratory:  Negative for cough and shortness of breath.   Cardiovascular:  Negative for chest pain and palpitations.  Gastrointestinal:  Negative for abdominal pain and vomiting.  Genitourinary:  Negative for dysuria and hematuria.  Musculoskeletal:  Negative for arthralgias and back pain.  Skin:  Negative for color change and rash.  Neurological:  Positive for syncope.  Negative for seizures.  All other systems reviewed and are negative.  Physical Exam Updated Vital Signs BP 119/77 (BP Location: Left Arm)   Pulse 78   Temp 98.4 F (36.9 C) (Oral)   Resp 15   Ht 5\' 2"  (1.575 m)   Wt 56.9 kg   SpO2 93%   BMI 22.95 kg/m   Physical Exam Vitals and nursing note reviewed.  Constitutional:      General: She is not in acute distress.    Appearance: She is well-developed.  HENT:     Head: Normocephalic and atraumatic.  Eyes:     Conjunctiva/sclera: Conjunctivae normal.  Cardiovascular:     Rate and Rhythm: Normal rate and regular rhythm.     Heart sounds: No murmur heard. Pulmonary:     Effort: Pulmonary effort is normal. No respiratory distress.     Breath sounds: Normal breath sounds.  Abdominal:     Palpations: Abdomen is soft.     Tenderness: There is no abdominal tenderness.  Musculoskeletal:        General: No deformity or signs of injury.     Cervical back: Neck supple.  Skin:    General: Skin is warm and dry.  Neurological:     General: No focal deficit present.     Mental Status: She is alert.  Psychiatric:        Mood and Affect: Mood normal.    ED Results / Procedures / Treatments   Labs (all labs ordered are listed, but only abnormal results are displayed) Labs Reviewed  BASIC METABOLIC PANEL - Abnormal; Notable for the following components:      Result Value   Potassium 3.3 (*)    Glucose, Bld 111 (*)    All other components within normal limits  CBC WITH DIFFERENTIAL/PLATELET - Abnormal; Notable for the following components:   WBC 11.3 (*)    Neutro Abs 9.6 (*)    Abs Immature Granulocytes 0.11 (*)    All other components within normal limits  MAGNESIUM - Abnormal; Notable for the following components:   Magnesium 1.6 (*)    All other components within normal limits  URINALYSIS, ROUTINE W REFLEX MICROSCOPIC - Abnormal; Notable for the following components:   APPearance HAZY (*)    All other components within  normal limits  COMPREHENSIVE METABOLIC PANEL - Abnormal; Notable for the following components:   Glucose, Bld 114 (*)    Calcium 8.4 (*)    Total Protein 5.6 (*)    Albumin 2.8 (*)    All other components within normal limits  CBC - Abnormal; Notable for the following components:   RBC 3.53 (*)    Hemoglobin 10.6 (*)    HCT 32.7 (*)    All other components within normal limits  CBG MONITORING, ED -  Abnormal; Notable for the following components:   Glucose-Capillary 115 (*)    All other components within normal limits  RESP PANEL BY RT-PCR (FLU A&B, COVID) ARPGX2  MAGNESIUM  TROPONIN I (HIGH SENSITIVITY)  TROPONIN I (HIGH SENSITIVITY)    EKG EKG Interpretation  Date/Time:  Friday January 06 2021 12:14:01 EDT Ventricular Rate:  64 PR Interval:  201 QRS Duration: 145 QT Interval:  432 QTC Calculation: 446 R Axis:   18 Text Interpretation: Sinus rhythm Left bundle branch block Confirmed by Madalyn Rob [phone]) on 01/06/2021 3:00:56 PM  Radiology DG Chest 2 View  Result Date: 01/06/2021 CLINICAL DATA:  87 year old female with history of syncope. EXAM: CHEST - 2 VIEW COMPARISON:  10/29/2019 FINDINGS: The mediastinal contours are within normal limits. No cardiomegaly. The lungs are clear bilaterally without evidence of focal consolidation, pleural effusion, or pneumothorax. No acute osseous abnormality. IMPRESSION: No acute cardiopulmonary process. Electronically Signed   By: Ruthann Cancer M.D.   On: 01/06/2021 14:51   CT Head Wo Contrast  Result Date: 01/06/2021 CLINICAL DATA:  Syncopal episode of 3-5 minutes duration at salon, no fall, lightheadedness, incontinence, history hypertension, melanoma EXAM: CT HEAD WITHOUT CONTRAST TECHNIQUE: Contiguous axial images were obtained from the base of the skull through the vertex without intravenous contrast. COMPARISON:  None. FINDINGS: Brain: Generalized atrophy. Normal ventricular morphology. No midline shift or mass effect. Small  vessel chronic ischemic changes of deep cerebral Anna matter. No intracranial hemorrhage, mass lesion, evidence of acute infarction, or extra-axial fluid collection. Vascular: No hyperdense vessels. Minimal atherosclerotic calcification of internal carotid arteries at skull base Skull: Intact Sinuses/Orbits: Clear Other: N/A IMPRESSION: Atrophy with small vessel chronic ischemic changes of deep cerebral Bloodgood matter. No acute intracranial abnormalities. Electronically Signed   By: Lavonia Dana M.D.   On: 01/06/2021 14:35    Procedures Procedures   Medications Ordered in ED Medications  atorvastatin (LIPITOR) tablet 20 mg (has no administration in time range)  hydrochlorothiazide (HYDRODIURIL) tablet 25 mg (has no administration in time range)  olopatadine (PATANOL) 0.1 % ophthalmic solution 1 drop (1 drop Both Eyes Given 01/06/21 2231)  sodium chloride flush (NS) 0.9 % injection 3 mL (3 mLs Intravenous Given 01/06/21 2231)  enoxaparin (LOVENOX) injection 40 mg (40 mg Subcutaneous Given 01/06/21 2230)  acetaminophen (TYLENOL) tablet 650 mg (has no administration in time range)    Or  acetaminophen (TYLENOL) suppository 650 mg (has no administration in time range)  irbesartan (AVAPRO) tablet 300 mg (300 mg Oral Given 01/06/21 2323)  amLODipine (NORVASC) tablet 10 mg (10 mg Oral Given 01/06/21 2323)  sodium chloride 0.9 % bolus 500 mL (0 mLs Intravenous Stopped 01/06/21 1552)  potassium chloride SA (KLOR-CON) CR tablet 40 mEq (40 mEq Oral Given 01/06/21 1959)  magnesium sulfate IVPB 2 g 50 mL (0 g Intravenous Stopped 01/06/21 2058)    ED Course  I have reviewed the triage vital signs and the nursing notes.  Pertinent labs & imaging results that were available during my care of the patient were reviewed by me and considered in my medical decision making (see chart for details).    MDM Rules/Calculators/A&P                           87 year old lady presents to the emergency room with concern for  syncopal episode while at nail salon.  On arrival to the ER here, patient appears well in no distress vital signs were stable.  EKG with left bundle branch block.  Ordered basic labs, CT head, chest x-ray and patient placed on cardiac monitor.  While awaiting work-up and reassessment, signed out to Dr. Dina Rich.  Please refer to her note for final plan and disposition.  Final Clinical Impression(s) / ED Diagnoses Final diagnoses:  Syncope, unspecified syncope type    Rx / DC Orders ED Discharge Orders     None        Lucrezia Starch, MD 01/07/21 0730

## 2021-01-06 NOTE — ED Provider Notes (Signed)
Patient signed out to me by previous provider. Please refer to their note for full HPI.  Briefly this is a 87 year old female who presents for syncope.  No prodromal symptoms.  Pending remainder of lab evaluation and UA.  Currently patient is resting, feels well, vital stable. Physical Exam  BP 138/66   Pulse 85   Temp (!) 97.5 F (36.4 C) (Oral)   Resp (!) 24   Ht 5\' 2"  (1.575 m)   Wt 57.6 kg   SpO2 97%   BMI 23.23 kg/m   Physical Exam  ED Course/Procedures     Procedures  MDM   Blood work shows mild hypokalemia and magnesium.  Patient reveals she is had episodes of diarrhea for the last 24 hours.  EKG shows left bundle branch block which is changed when compared to an EKG from a year ago.  Patient now reveals episodes of indigestion for the last 24 hours.  Concern for EKG change in the setting of seated unprovoked syncope with her elderly age and comorbidities.  Spoke with the patient and family they are amendable to admission.  Patients evaluation and results requires admission for further treatment and care. Patient agrees with admission plan, offers no new complaints and is stable/unchanged at time of admit.       Lorelle Gibbs, DO 01/06/21 1913

## 2021-01-06 NOTE — ED Triage Notes (Signed)
Pt was at the salon and had a syncopal episode that lasted about 3-5 minutes while sitting in her chair. She did not fall or hit her head. Pt reports light headedness and incontinent episode. Pt was at PCP yesterday for physical and reports "everything was normal"

## 2021-01-06 NOTE — ED Notes (Signed)
Pt had incontinent episode w/diarrhea and was unable to provide urine sample

## 2021-01-06 NOTE — H&P (Addendum)
History and Physical   Brandy Miranda OEV:[id number] DOB: [birth year] DOA: 01/06/2021  PCP: Shon Baton, MD   Patient coming from: Home  Chief Complaint: Syncope  HPI: Brandy Miranda is a 87 y.o. female with medical history significant of regionally metastatic melanoma status post resection and adjuvant nivolumab, hypertension, hyperlipidemia, hard of hearing presenting after episode of syncope.  Patient was getting her haircut/earlier today and she had an episode of syncope.  This reportedly lasted several minutes while she was sitting in the chair.  She did not fall or hit her head.  She was reportedly staring off in space with her eyes open and did slump over some.  No prodrome nor postictal symptoms.  She reports symptoms after the event of lightheadedness and also reports episode of incontinence.  Reports feeling some indigestion that has come and gone, but qualifies this as being more of a hunger sensation.  But otherwise she felt normal yesterday and even had her physical with her PCP yesterday.  He does report episode of diarrhea 2 days ago.  Otherwise no diarrhea until her incontinence that occurred during the event. She denies fevers, chills, chest pain, shortness of breath, vomiting, constipation, nausea, vomiting.   ED Course: Vital signs in the ED stable.  Lab work-up showed BMP with potassium of 3.3, magnesium 1.6.  CBC with mild leukocytosis to 11.3.  Troponin normal on first check with repeat pending.  Urinalysis with no evidence of UTI.  Chest x-ray showed no acute normality.  CT head showed no acute abnormality.  EKG showed new left bundle branch block.  Patient received 500 cc bolus in the ED.  Also had an episode of incontinence in the ED.  Review of Systems: As per HPI otherwise all other systems reviewed and are negative.  Past Medical History:  Diagnosis Date   Cancer ([place]) 2021   Melanoma, back   HOH (hard of hearing)    Wears bilateral hearing aids   Hypercholesteremia     Hypertension     Past Surgical History:  Procedure Laterality Date   MELANOMA EXCISION WITH SENTINEL LYMPH NODE BIOPSY Left 11/04/2019   Procedure: WIDE LOCAL EXCISION  LEFT UPPER BACK MELANOMA WITH ADVANCEMENT FLAP CLOSURE;  Surgeon: [name], MD;  Location: [facility];  Service: General;  Laterality: Left;   SENTINEL NODE BIOPSY Left 11/04/2019   Procedure: SENTINEL NODE BIOPSY;  Surgeon: [name], MD;  Location: [facility];  Service: General;  Laterality: Left;    Social History  reports that she has never smoked. She has never used smokeless tobacco. She reports current alcohol use. She reports that she does not use drugs.  No Known Allergies  Family History  Problem Relation Age of Onset   Hypertension Mother   Reviewed on admission  Prior to Admission medications   Medication Sig Start Date End Date Taking? Authorizing Provider  amLODipine (NORVASC) 10 MG tablet Take 10 mg by mouth daily. 06/30/19  Yes [provider]  Ascorbic Acid (VITAMIN C PO) Take 2 tablets by mouth daily.   Yes [provider]  aspirin EC 81 MG tablet Take 81 mg by mouth every Sunday.   Yes [provider]  atorvastatin (LIPITOR) 20 MG tablet Take 20 mg by mouth daily. 02/10/18  Yes [provider]  hydrALAZINE (APRESOLINE) 10 MG tablet Take 10 mg by mouth daily as needed (if Systolic B/P is 829 or greater).   Yes [provider]  hydrochlorothiazide (HYDRODIURIL) 25 MG tablet Take  25 mg by mouth daily. 01/20/18  Yes [provider]  olmesartan (BENICAR) 40 MG tablet Take 40 mg by mouth daily. 01/13/18  Yes [provider]  Olopatadine HCl (PATADAY OP) Place 1 drop into both eyes in the morning and at bedtime.   Yes [provider]  OVER THE COUNTER MEDICATION Take 3 tablets by mouth daily. DoTerra - bone nutrient supplement   Yes [provider]  OVER THE COUNTER MEDICATION Take 1 tablet by mouth daily. DoTerra supplement -  omega's, etc.    Yes [provider]  OVER THE COUNTER MEDICATION Take 1 tablet by mouth daily. DoTerra supplement - alpha crs   Yes [provider]  OVER THE COUNTER MEDICATION Take 1 capsule by mouth daily. DoTerra - microplex   Yes [provider]  traMADol (ULTRAM) 50 MG tablet Take 1 tablet (50 mg total) by mouth every 6 (six) hours as needed for moderate pain or severe pain. Patient not taking: Reported on 01/06/2021 11/04/19   [name], MD    Physical Exam: Vitals:   01/06/21 1745 01/06/21 1830 01/06/21 1845 01/06/21 1932  BP: 138/65  138/66 (!) 167/80  Pulse: 75  85 84  Resp: 19 17 (!) 24 15  Temp:    98.5 F (36.9 C)  TempSrc:    Oral  SpO2: 95%  97% 96%  Weight:      Height:       Physical Exam Constitutional:      General: She is not in acute distress.    Appearance: Normal appearance.  HENT:     Head: Normocephalic and atraumatic.     Mouth/Throat:     Mouth: Mucous membranes are moist.     Pharynx: Oropharynx is clear.  Eyes:     Extraocular Movements: Extraocular movements intact.     Pupils: Pupils are equal, round, and reactive to light.  Cardiovascular:     Rate and Rhythm: Normal rate and regular rhythm.     Pulses: Normal pulses.     Heart sounds: Murmur heard.  Pulmonary:     Effort: Pulmonary effort is normal. No respiratory distress.     Breath sounds: Normal breath sounds.  Abdominal:     General: Bowel sounds are normal. There is no distension.     Palpations: Abdomen is soft.     Tenderness: There is no abdominal tenderness.  Musculoskeletal:        General: No swelling or deformity.  Skin:    General: Skin is warm and dry.  Neurological:     General: No focal deficit present.     Mental Status: Mental status is at baseline.   Labs on Admission: I have personally reviewed following labs and imaging studies  CBC: Recent Labs  Lab 01/06/21 1332  WBC 11.3*  NEUTROABS 9.6*  HGB 12.2  HCT 37.3  MCV 91.0   PLT 025    Basic Metabolic Panel: Recent Labs  Lab 01/06/21 1332 01/06/21 1348  NA 137  --   K 3.3*  --   CL 101  --   CO2 30  --   GLUCOSE 111*  --   BUN 22  --   CREATININE 0.82  --   CALCIUM 9.3  --   MG  --  1.6*    GFR: Estimated Creatinine Clearance: 34.6 mL/min (by C-G formula based on SCr of 0.82 mg/dL).  Liver Function Tests: No results for input(s): AST, ALT, ALKPHOS, BILITOT, PROT, ALBUMIN in  the last 168 hours.  Urine analysis:    Component Value Date/Time   COLORURINE YELLOW 01/06/2021 1520   APPEARANCEUR HAZY (A) 01/06/2021 1520   LABSPEC 1.017 01/06/2021 1520   PHURINE 5.0 01/06/2021 1520   GLUCOSEU NEGATIVE 01/06/2021 1520   HGBUR NEGATIVE 01/06/2021 1520   BILIRUBINUR NEGATIVE 01/06/2021 [place] 01/06/2021 1520   PROTEINUR NEGATIVE 01/06/2021 1520   NITRITE NEGATIVE 01/06/2021 1520   LEUKOCYTESUR NEGATIVE 01/06/2021 1520    Radiological Exams on Admission: DG Chest 2 View  Result Date: 01/06/2021 CLINICAL DATA:  87 year old female with history of syncope. EXAM: CHEST - 2 VIEW COMPARISON:  10/29/2019 FINDINGS: The mediastinal contours are within normal limits. No cardiomegaly. The lungs are clear bilaterally without evidence of focal consolidation, pleural effusion, or pneumothorax. No acute osseous abnormality. IMPRESSION: No acute cardiopulmonary process. Electronically Signed   By: Ruthann Cancer M.D.   On: 01/06/2021 14:51   CT Head Wo Contrast  Result Date: 01/06/2021 CLINICAL DATA:  Syncopal episode of 3-5 minutes duration at salon, no fall, lightheadedness, incontinence, history hypertension, melanoma EXAM: CT HEAD WITHOUT CONTRAST TECHNIQUE: Contiguous axial images were obtained from the base of the skull through the vertex without intravenous contrast. COMPARISON:  None. FINDINGS: Brain: Generalized atrophy. Normal ventricular morphology. No midline shift or mass effect. Small vessel chronic ischemic changes of deep cerebral  Lambertson matter. No intracranial hemorrhage, mass lesion, evidence of acute infarction, or extra-axial fluid collection. Vascular: No hyperdense vessels. Minimal atherosclerotic calcification of internal carotid arteries at skull base Skull: Intact Sinuses/Orbits: Clear Other: N/A IMPRESSION: Atrophy with small vessel chronic ischemic changes of deep cerebral Bazzi matter. No acute intracranial abnormalities. Electronically Signed   By: Lavonia Dana M.D.   On: 01/06/2021 14:35    EKG: Independently reviewed.  Sinus rhythm at 64 bpm.  Left bundle branch block with repolarization abnormality/J-point elevation.  Left bundle branch block is new from previous.  He did have borderline QRS duration in August of last year at 96 ms.  Assessment/Plan Principal Problem:   Syncope and collapse Active Problems:   Malignant melanoma of lower back (HCC)   HTN (hypertension)   HLD (hyperlipidemia)   Hypokalemia   Hypomagnesemia   LBBB (left bundle branch block)  Syncope and collapse > Episode of syncope while having her nails done.  Lasted several minutes per report, however family is clarifying this.  Did not hit her head or fall.  Did have episode of incontinence. > She was reportedly staring off in space with her eyes open and did slump over some.  No prodrome or postictal symptoms. > Patient reports some intermittent indigestion recently an episode of diarrhea.  Otherwise feeling well. > Noted to be mildly hypokalemic at 3.3 and hypomagnesemic at 1.6.  Also noted to have new left bundle branch block.  Previous EKG from 1 year ago did not show left bundle branch block but did show some developing morphology and borderline QRS. > Overall etiology unclear.  Has some features of arrhythmogenic considering lack of prodrome; also some features of possible absence seizure as she was staring off into space and slumped over some.  Did have episode of incontinence however she had a recurrent episode while alert in the ED.   > Does have history of locally metastatic melanoma of the back and axilla.  CT head in the ED negative for any acute normalities. May be worth running this by neurology if other work-up is negative to see if CT evaluation is  adequate in this setting or if MRI is recommended. - Monitor on telemetry - Echocardiogram - Orthostatic vital signs - Trend renal function and electrolyte  Hypokalemia Hypomagnesemia > As above potassium 3.3 in the ED magnesium 1.6. - Replete with 40 mEq p.o. potassium and 2 g IV magnesium. - Trend electrolytes  Left bundle branch block.  > New. Previous EKG from 1 year ago did not show left bundle branch block but did show some developing morphology and borderline QRS. - Replete electrolytes as above, continue to monitor  Leukocytosis > Noted to have leukocytosis that was mild to 11.3 in the ED.  No fever.  Urinalysis without evidence of infection, chest x-ray without acute abnormality. > Possibly reactive versus some degree of gastroenteritis that she has had a little bit of indigestion/diarrhea with a couple episodes of incontinence. - Continue to monitor fever curve and [place] count for now  Hypertension - Continue home amlodipine, hydrochlorothiazide - Replace home olmesartan with formulary irbesartan  Hyperlipidemia - Continue home atorvastatin  Metastatic melanoma > Status post multiple resections.  Also on adjuvant nivolumab have been held for a while due to side effects but she will be trying this again per chart review.  DVT prophylaxis: Lovenox Code Status:   DNR.  Patient states she may discuss this further with family.  She states she is DNR at baseline but family has some concerns considering her new left bundle branch block. Family Communication:  Daughter updated at bedside. Disposition Plan:   Patient is from:  Home  Anticipated DC to:  Home  Anticipated DC date:  1 to 2 days  Anticipated DC barriers: None  Consults called:   None Admission status:  Observation, telemetry  Severity of Illness: The appropriate patient status for this patient is OBSERVATION. Observation status is judged to be reasonable and necessary in order to provide the required intensity of service to ensure the patient's safety. The patient's presenting symptoms, physical exam findings, and initial radiographic and laboratory data in the context of their medical condition is felt to place them at decreased risk for further clinical deterioration. Furthermore, it is anticipated that the patient will be medically stable for discharge from the hospital within 2 midnights of admission.    Marcelyn Bruins MD [facility]  How to contact the [facility] Attending or Consulting provider [place] or covering provider during after hours [place], for this patient?   Check the care team in [facility] and look for a) attending/consulting [facility] provider listed and b) the [facility] team listed Log into [email or web address] and use [place]'s universal password to access. If you do not have the password, please contact the hospital operator. Locate the [facility] provider you are looking for under [facility] and page to a number that you can be directly reached. If you still have difficulty reaching the provider, please page the [facility] (Director on Call) for the Hospitalists listed on amion for assistance.  01/06/2021, 7:43 PM

## 2021-01-06 NOTE — ED Notes (Signed)
Patient transported to CT 

## 2021-01-06 NOTE — ED Notes (Addendum)
Note from patient's daughter's phone of nail technicians' observation: "I would say a good 5 minutes. I called out her name about 6 times and then called you. Hung up and called 911 and talked to that lady to for 2-3 minutes. Long enough to answer 8 questions and to watch your mom's chest to see it expand for 5 count. Then when the fire department arrived...she regained consciousness right when they pulled up before they came inside. When she was "out if it" her eyes were set and she was not blinking. Her eyes were squinty.Marland Kitchenleft eye more than right. Please ask why her nose ran like a faucet out of her right nostril at the same time that she was having labored breathing! The color coming out of her nose was yellowish. She had the labored breathing for about 4-5 breaths. Very hard breaths like.Marland Kitchengrunts. But she was still out of it when that was going on. Then she came to and was fine. Absolutely fine. A little weak but her blood pressure was low. She answered every question that they asked."

## 2021-01-07 ENCOUNTER — Observation Stay (HOSPITAL_COMMUNITY): Payer: Medicare Other

## 2021-01-07 ENCOUNTER — Observation Stay (HOSPITAL_BASED_OUTPATIENT_CLINIC_OR_DEPARTMENT_OTHER): Payer: Medicare Other

## 2021-01-07 ENCOUNTER — Encounter (HOSPITAL_COMMUNITY): Payer: Self-pay | Admitting: Internal Medicine

## 2021-01-07 DIAGNOSIS — I10 Essential (primary) hypertension: Secondary | ICD-10-CM | POA: Diagnosis not present

## 2021-01-07 DIAGNOSIS — Z85820 Personal history of malignant melanoma of skin: Secondary | ICD-10-CM | POA: Diagnosis not present

## 2021-01-07 DIAGNOSIS — R55 Syncope and collapse: Secondary | ICD-10-CM

## 2021-01-07 DIAGNOSIS — Z20822 Contact with and (suspected) exposure to covid-19: Secondary | ICD-10-CM | POA: Diagnosis not present

## 2021-01-07 LAB — ECHOCARDIOGRAM COMPLETE
AR max vel: 1.83 cm2
AV Area VTI: 1.82 cm2
AV Area mean vel: 1.79 cm2
AV Mean grad: 13.3 mmHg
AV Peak grad: 21.9 mmHg
Ao pk vel: 2.34 m/s
Area-P 1/2: 5.42 cm2
Height: 62 in
S' Lateral: 2.1 cm
Weight: 2008 oz

## 2021-01-07 LAB — COMPREHENSIVE METABOLIC PANEL
ALT: 13 U/L (ref 0–44)
AST: 17 U/L (ref 15–41)
Albumin: 2.8 g/dL — ABNORMAL LOW (ref 3.5–5.0)
Alkaline Phosphatase: 40 U/L (ref 38–126)
Anion gap: 5 (ref 5–15)
BUN: 17 mg/dL (ref 8–23)
CO2: 29 mmol/L (ref 22–32)
Calcium: 8.4 mg/dL — ABNORMAL LOW (ref 8.9–10.3)
Chloride: 105 mmol/L (ref 98–111)
Creatinine, Ser: 0.87 mg/dL (ref 0.44–1.00)
GFR, Estimated: >60 mL/min (ref >60)
Glucose, Bld: 114 mg/dL — ABNORMAL HIGH (ref 70–99)
Potassium: 3.7 mmol/L (ref 3.5–5.1)
Sodium: 139 mmol/L (ref 135–145)
Total Bilirubin: 0.6 mg/dL (ref 0.3–1.2)
Total Protein: 5.6 g/dL — ABNORMAL LOW (ref 6.5–8.1)

## 2021-01-07 LAB — CBC
HCT: 32.7 % — ABNORMAL LOW (ref 36.0–46.0)
Hemoglobin: 10.6 g/dL — ABNORMAL LOW (ref 12.0–15.0)
MCH: 30 pg (ref 26.0–34.0)
MCHC: 32.4 g/dL (ref 30.0–36.0)
MCV: 92.6 fL (ref 80.0–100.0)
Platelets: 211 10*3/uL (ref 150–400)
RBC: 3.53 MIL/uL — ABNORMAL LOW (ref 3.87–5.11)
RDW: 13.3 % (ref 11.5–15.5)
WBC: 6.7 10*3/uL (ref 4.0–10.5)
nRBC: 0 % (ref 0.0–0.2)

## 2021-01-07 LAB — MAGNESIUM: Magnesium: 2.1 mg/dL (ref 1.7–2.4)

## 2021-01-07 MED ORDER — GADOBUTROL 1 MMOL/ML IV SOLN
5.6000 mL | Freq: Once | INTRAVENOUS | Status: AC | PRN
  Administered 2021-01-07: 5.6 mL via INTRAVENOUS

## 2021-01-07 MED ORDER — POTASSIUM CHLORIDE CRYS ER 20 MEQ PO TBCR
40.0000 meq | EXTENDED_RELEASE_TABLET | Freq: Once | ORAL | Status: AC
  Administered 2021-01-07: 40 meq via ORAL
  Filled 2021-01-07: qty 2

## 2021-01-07 MED ORDER — LORAZEPAM 2 MG/ML IJ SOLN
0.5000 mg | Freq: Once | INTRAMUSCULAR | Status: AC
Start: 2021-01-07 — End: 2021-01-07
  Administered 2021-01-07: 0.5 mg via INTRAVENOUS
  Filled 2021-01-07: qty 1

## 2021-01-07 NOTE — Progress Notes (Signed)
  Echocardiogram 2D Echocardiogram has been performed.  Brandy Miranda 01/07/2021, 9:37 AM

## 2021-01-07 NOTE — Progress Notes (Signed)
PROGRESS NOTE    Brandy Miranda   ACZ:[id number]  DOB: 08-Jul-1926  PCP: Shon Baton, MD    DOA: 01/06/2021 LOS: 0    Brief Narrative / Hospital Course to Date:   87 y.o. 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    See ED RN's note of 01/06/21 8:05 PM for description by those who witnessed the episode.  Assessment & Plan   Principal Problem:   Syncope and collapse Active Problems:   Malignant melanoma of lower back (HCC)   HTN (hypertension)   HLD (hyperlipidemia)   Hypokalemia   Hypomagnesemia   LBBB (left bundle branch block)   Leukocytosis   Episode of Unresponsiveness - unclear if syncope or absence seizure vs cardiogenic - episode while having nails done, was reportedly unresponsive and appeared to be staring off into space for about 5 minutes.  Witnesses report labored appearing breathing during the episode.   Case discussed with neurology, recommended proceeding with EEG and agreed with MRI brain. --follow up pending studies:  Echo,  MRI brain (given hx of metastatic melanoma),  EEG --monitor and replace electrolytes --telemetry monitoring --orthostatic vitals    Hypokalemia / Hypomagnesemia - replaced K and Mg on admission.  Monitor and replace as needed.  Goal K>4, Mg>2.  Left bundle branch block - new, seen on admission EKG, not on prior EKG's.  K and Mg replaced. No chest pain, troponin negative x 2. --repeat EKG today --replace K and Mg as needed --telemetry --Discussed with cardiologist on call, no ischemic or other cardiac evaluation recommended.  Leukocytosis - WBC 11.3k on admission. Afebrile.  UA and CXR negative.  Patient without any infectious symptoms.  Suspect reactive --Monitor CBC and fever curve  Hypertension - continued on home amlodipine and HCTZ, ARB  substituted per formulary.  Hyperlipidemia - continue atorvastatin  Metastatic melanoma - regionally metastatic, no know distant mets.  S/p multiple resections.  On adjuvant nivolumab which has been on hold due to side effects.   --MRI brain pending as above  Patient BMI: Body mass index is 22.95 kg/m.   DVT prophylaxis: enoxaparin (LOVENOX) injection 40 mg Start: 01/06/21 2100   Diet:  Diet Orders (From admission, onward)     Start     Ordered   01/06/21 1901  Diet Heart Room service appropriate? Yes; Fluid consistency: Thin  Diet effective now       Question Answer Comment  Room service appropriate? Yes   Fluid consistency: Thin      01/06/21 1902              Code Status: DNR   Subjective 01/07/21    Pt seen with daughter at bedside.  She denies CP, SOB, dizziness or lightheadedness.  No acute complaints.  Agreeable to MRI and any other tests that might be indicated.     Disposition Plan & Communication   Status is: Observation  The patient remains OBS appropriate and will d/c before 2 midnights.   Family Communication: daughter at bedside on rounds    Consults, Procedures, Significant Events   Consultants:  None  Procedures:  None  Antimicrobials:  Anti-infectives (From admission, onward)    None         Micro    Objective   Vitals:   01/06/21 1932 01/06/21 2121 01/07/21 0557 01/07/21 1501  BP: (!) 167/80 133/67 119/77 Marland Kitchen)  141/71  Pulse: 84  78   Resp: 15 14 15 18   Temp: 98.5 F (36.9 C) 98.4 F (36.9 C) 98.4 F (36.9 C) 98 F (36.7 C)  TempSrc: Oral Oral Oral Oral  SpO2: 96% 96% 93%   Weight:  56.7 kg 56.9 kg   Height:  5\' 2"  (1.575 m)      Intake/Output Summary (Last 24 hours) at 01/07/2021 1517 Last data filed at 01/07/2021 0933 Gross per 24 hour  Intake 1103 ml  Output --  Net 1103 ml   Filed Weights   01/06/21 1216 01/06/21 2121 01/07/21 0557  Weight: 57.6 kg 56.7 kg 56.9 kg    Physical Exam:  General exam:  awake, alert, no acute distress HEENT: atraumatic, clear conjunctiva, anicteric sclera, moist mucus membranes, hearing grossly normal  Respiratory system: CTAB, no wheezes, rales or rhonchi, normal respiratory effort. Cardiovascular system: normal S1/S2, RRR, no JVD, murmurs, rubs, gallops, no pedal edema.   Gastrointestinal system: soft, NT, ND, no HSM felt, +bowel sounds. Central nervous system: A&O x3. no gross focal neurologic deficits, normal speech Extremities: moves all, no edema, normal tone Skin: dry, intact, normal temperature, normal color, No rashes, lesions or ulcers seen on visualized skin Psychiatry: normal mood, congruent affect, judgement and insight appear normal  Labs   Data Reviewed: I have personally reviewed following labs and imaging studies  CBC: Recent Labs  Lab 01/06/21 1332 01/07/21 0210  WBC 11.3* 6.7  NEUTROABS 9.6*  --   HGB 12.2 10.6*  HCT 37.3 32.7*  MCV 91.0 92.6  PLT 236 353   Basic Metabolic Panel: Recent Labs  Lab 01/06/21 1332 01/06/21 1348 01/07/21 0210  NA 137  --  139  K 3.3*  --  3.7  CL 101  --  105  CO2 30  --  29  GLUCOSE 111*  --  114*  BUN 22  --  17  CREATININE 0.82  --  0.87  CALCIUM 9.3  --  8.4*  MG  --  1.6* 2.1   GFR: Estimated Creatinine Clearance: 32.6 mL/min (by C-G formula based on SCr of 0.87 mg/dL). Liver Function Tests: Recent Labs  Lab 01/07/21 0210  AST 17  ALT 13  ALKPHOS 40  BILITOT 0.6  PROT 5.6*  ALBUMIN 2.8*   No results for input(s): LIPASE, AMYLASE in the last 168 hours. No results for input(s): AMMONIA in the last 168 hours. Coagulation Profile: No results for input(s): INR, PROTIME in the last 168 hours. Cardiac Enzymes: No results for input(s): CKTOTAL, CKMB, CKMBINDEX, TROPONINI in the last 168 hours. BNP (last 3 results) No results for input(s): PROBNP in the last 8760 hours. HbA1C: No results for input(s): HGBA1C in the last 72 hours. CBG: Recent Labs  Lab 01/06/21 1413   GLUCAP 115*   Lipid Profile: No results for input(s): CHOL, HDL, LDLCALC, TRIG, CHOLHDL, LDLDIRECT in the last 72 hours. Thyroid Function Tests: No results for input(s): TSH, T4TOTAL, FREET4, T3FREE, THYROIDAB in the last 72 hours. Anemia Panel: No results for input(s): VITAMINB12, FOLATE, FERRITIN, TIBC, IRON, RETICCTPCT in the last 72 hours. Sepsis Labs: No results for input(s): PROCALCITON, LATICACIDVEN in the last 168 hours.  Recent Results (from the past 240 hour(s))  Resp Panel by RT-PCR (Flu A&B, Covid) Nasopharyngeal Swab     Status: None   Collection Time: 01/06/21  7:14 PM   Specimen: Nasopharyngeal Swab; Nasopharyngeal(NP) swabs in vial transport medium  Result Value Ref Range Status   SARS Coronavirus 2  by RT PCR NEGATIVE NEGATIVE Final    Comment: (NOTE) SARS-CoV-2 target nucleic acids are NOT DETECTED.  The SARS-CoV-2 RNA is generally detectable in upper respiratory specimens during the acute phase of infection. The lowest concentration of SARS-CoV-2 viral copies this assay can detect is 138 copies/mL. A negative result does not preclude SARS-Cov-2 infection and should not be used as the sole basis for treatment or other patient management decisions. A negative result may occur with  improper specimen collection/handling, submission of specimen other than nasopharyngeal swab, presence of viral mutation(s) within the areas targeted by this assay, and inadequate number of viral copies(<138 copies/mL). A negative result must be combined with clinical observations, patient history, and epidemiological information. The expected result is Negative.  Fact Sheet for Patients:  EntrepreneurPulse.com.au  Fact Sheet for Healthcare Providers:  IncredibleEmployment.be  This test is no t yet approved or cleared by the [place] FDA and  has been authorized for detection and/or diagnosis of SARS-CoV-2 by FDA under an Emergency Use  Authorization (EUA). This EUA will remain  in effect (meaning this test can be used) for the duration of the COVID-19 declaration under Section 564(b)(1) of the Act, 21 U.S.C.section 360bbb-3(b)(1), unless the authorization is terminated  or revoked sooner.       Influenza A by PCR NEGATIVE NEGATIVE Final   Influenza B by PCR NEGATIVE NEGATIVE Final    Comment: (NOTE) The Xpert Xpress SARS-CoV-2/FLU/RSV plus assay is intended as an aid in the diagnosis of influenza from Nasopharyngeal swab specimens and should not be used as a sole basis for treatment. Nasal washings and aspirates are unacceptable for Xpert Xpress SARS-CoV-2/FLU/RSV testing.  Fact Sheet for Patients: EntrepreneurPulse.com.au  Fact Sheet for Healthcare Providers: IncredibleEmployment.be  This test is not yet approved or cleared by the [place] FDA and has been authorized for detection and/or diagnosis of SARS-CoV-2 by FDA under an Emergency Use Authorization (EUA). This EUA will remain in effect (meaning this test can be used) for the duration of the COVID-19 declaration under Section 564(b)(1) of the Act, 21 U.S.C. section 360bbb-3(b)(1), unless the authorization is terminated or revoked.  Performed at [facility], [place] [address] [place], [place] [address]       Imaging Studies   DG Chest 2 View  Result Date: 01/06/2021 CLINICAL DATA:  87 year old female with history of syncope. EXAM: CHEST - 2 VIEW COMPARISON:  10/29/2019 FINDINGS: The mediastinal contours are within normal limits. No cardiomegaly. The lungs are clear bilaterally without evidence of focal consolidation, pleural effusion, or pneumothorax. No acute osseous abnormality. IMPRESSION: No acute cardiopulmonary process. Electronically Signed   By: Ruthann Cancer M.D.   On: 01/06/2021 14:51   CT Head Wo Contrast  Result Date: 01/06/2021 CLINICAL DATA:  Syncopal episode of 3-5 minutes duration at  salon, no fall, lightheadedness, incontinence, history hypertension, melanoma EXAM: CT HEAD WITHOUT CONTRAST TECHNIQUE: Contiguous axial images were obtained from the base of the skull through the vertex without intravenous contrast. COMPARISON:  None. FINDINGS: Brain: Generalized atrophy. Normal ventricular morphology. No midline shift or mass effect. Small vessel chronic ischemic changes of deep cerebral [place] matter. No intracranial hemorrhage, mass lesion, evidence of acute infarction, or extra-axial fluid collection. Vascular: No hyperdense vessels. Minimal atherosclerotic calcification of internal carotid arteries at skull base Skull: Intact Sinuses/Orbits: Clear Other: N/A IMPRESSION: Atrophy with small vessel chronic ischemic changes of deep cerebral [place] matter. No acute intracranial abnormalities. Electronically Signed   By: Crist Infante.D.  On: 01/06/2021 14:35   ECHOCARDIOGRAM COMPLETE  Result Date: 01/07/2021    ECHOCARDIOGRAM REPORT   Patient Name:   Brandy Miranda Date of Exam: 01/07/2021 Medical Rec #:  [id number]    Height:       62.0 in Accession #:    [id number]   Weight:       125.5 lb Date of Birth:  03-Aug-1926    BSA:          1.568 m Patient Age:    51 years     BP:           119/77 mmHg Patient Gender: F            HR:           92 bpm. Exam Location:  Inpatient Procedure: 2D Echo, Cardiac Doppler and Color Doppler Indications:    R55 Syncope  History:        Patient has prior history of Echocardiogram examinations, most                 recent 04/11/2018. Risk Factors:Hypertension.  Sonographer:    Bernadene Person RDCS Referring Phys: [id number] [place]  1. Left ventricular ejection fraction, by estimation, is 60 to 65%. The left ventricle has normal function. The left ventricle has no regional wall motion abnormalities. There is mild left ventricular hypertrophy of the basal-septal segment. Left ventricular diastolic parameters are consistent with Grade I diastolic  dysfunction (impaired relaxation).  2. Right ventricular systolic function is normal. The right ventricular size is normal. There is mildly elevated pulmonary artery systolic pressure.  3. The mitral valve is normal in structure. No evidence of mitral valve regurgitation. No evidence of mitral stenosis.  4. The tricuspid valve is abnormal. Tricuspid valve regurgitation is moderate.  5. Aortic valve Dopplers are off angle likely leading to underestimation of gradients. By AVA and measured gradients there is mild AS. Visually the valve appears to have moderate to severe aortic stenosis. By planimetry area is 1.5 suggesting moderate stenosis. Consider repeat limited study to further evaluate aortic stenosis, particularly given patient presented with syncope. . The aortic valve is tricuspid. There is moderate calcification of the aortic valve. There is moderate thickening of the aortic valve. Aortic valve regurgitation is not visualized. FINDINGS  Left Ventricle: Left ventricular ejection fraction, by estimation, is 60 to 65%. The left ventricle has normal function. The left ventricle has no regional wall motion abnormalities. The left ventricular internal cavity size was normal in size. There is  mild left ventricular hypertrophy of the basal-septal segment. Left ventricular diastolic parameters are consistent with Grade I diastolic dysfunction (impaired relaxation). Normal left ventricular filling pressure. Right Ventricle: The right ventricular size is normal. No increase in right ventricular wall thickness. Right ventricular systolic function is normal. There is mildly elevated pulmonary artery systolic pressure. The tricuspid regurgitant velocity is 3.14  m/s, and with an assumed right atrial pressure of 3 mmHg, the estimated right ventricular systolic pressure is 33.3 mmHg. Left Atrium: Left atrial size was normal in size. Right Atrium: Right atrial size was normal in size. Pericardium: There is no evidence of  pericardial effusion. Mitral Valve: The mitral valve is normal in structure. There is mild thickening of the mitral valve leaflet(s). There is mild calcification of the mitral valve leaflet(s). Mild mitral annular calcification. No evidence of mitral valve regurgitation. No evidence of mitral valve stenosis. Tricuspid Valve: The tricuspid valve is abnormal. Tricuspid valve regurgitation is moderate .  No evidence of tricuspid stenosis. Aortic Valve: Aortic valve Dopplers are off angle likely leading to underestimation of gradients. By AVA and measured gradients there is mild AS. Visually the valve appears to have moderate to severe aortic stenosis. By planimetry area is 1.5 suggesting moderate stenosis. Consider repeat limited study to further evaluate aortic stenosis, particularly given patient presented with syncope. The aortic valve is tricuspid. There is moderate calcification of the aortic valve. There is moderate thickening of the aortic valve. There is moderate aortic valve annular calcification. Aortic valve regurgitation is not visualized. Aortic valve mean gradient measures 13.3 mmHg. Aortic valve peak gradient measures 21.9 mmHg. Aortic valve area, by VTI measures 1.82 cm. Pulmonic Valve: The pulmonic valve was not well visualized. Pulmonic valve regurgitation is mild. No evidence of pulmonic stenosis. Aorta: The aortic root is normal in size and structure. IAS/Shunts: No atrial level shunt detected by color flow Doppler.  LEFT VENTRICLE PLAX 2D LVIDd:         3.30 cm   Diastology LVIDs:         2.10 cm   LV e' medial:    6.24 cm/s LV PW:         1.00 cm   LV E/e' medial:  13.7 LV IVS:        1.20 cm   LV e' lateral:   6.41 cm/s LVOT diam:     2.00 cm   LV E/e' lateral: 13.3 LV SV:         86 LV SV Index:   55 LVOT Area:     3.14 cm  RIGHT VENTRICLE RV S prime:     16.30 cm/s TAPSE (M-mode): 1.8 cm LEFT ATRIUM             Index        RIGHT ATRIUM          Index LA diam:        3.10 cm 1.98 cm/m   RA  Area:     9.84 cm LA Vol (A2C):   38.9 ml 24.81 ml/m  RA Volume:   20.40 ml 13.01 ml/m LA Vol (A4C):   33.5 ml 21.36 ml/m LA Biplane Vol: 38.3 ml 24.42 ml/m  AORTIC VALVE AV Area (Vmax):    1.83 cm AV Area (Vmean):   1.79 cm AV Area (VTI):     1.82 cm AV Vmax:           233.80 cm/s AV Vmean:          170.168 cm/s AV VTI:            0.473 m AV Peak Grad:      21.9 mmHg AV Mean Grad:      13.3 mmHg LVOT Vmax:         136.00 cm/s LVOT Vmean:        97.200 cm/s LVOT VTI:          0.273 m LVOT/AV VTI ratio: 0.58  AORTA Ao Root diam: 3.30 cm Ao Asc diam:  3.70 cm MITRAL VALVE                TRICUSPID VALVE MV Area (PHT): 5.42 cm     TR Peak grad:   39.4 mmHg MV Decel Time: 140 msec     TR Vmax:        314.00 cm/s MV E velocity: 85.20 cm/s MV A velocity: 140.00 cm/s  SHUNTS MV E/A ratio:  0.61  Systemic VTI:  0.27 m                             Systemic Diam: 2.00 cm Carlyle Dolly MD Electronically signed by Carlyle Dolly MD Signature Date/Time: 01/07/2021/11:59:22 AM    Final      Medications   Scheduled Meds:  amLODipine  10 mg Oral Daily   atorvastatin  20 mg Oral Daily   enoxaparin (LOVENOX) injection  40 mg Subcutaneous Q24H   hydrochlorothiazide  25 mg Oral Daily   irbesartan  300 mg Oral QHS   olopatadine  1 drop Both Eyes BID   sodium chloride flush  3 mL Intravenous Q12H   Continuous Infusions:     LOS: 0 days    Time spent: 30 minutes    Ezekiel Slocumb, DO [facility]  01/07/2021, 3:17 PM      If 7PM-7AM, please contact night-coverage. How to contact the [facility] Attending or Consulting provider [place] or covering provider during after hours [place], for this patient?    Check the care team in [facility] and look for a) attending/consulting [facility] provider listed and b) the [facility] team listed Log into [email or web address] and use [place]'s universal password to access. If you do not have the password, please contact the hospital operator. Locate the [facility] provider you are  looking for under [facility] and page to a number that you can be directly reached. If you still have difficulty reaching the provider, please page the [facility] (Director on Call) for the Hospitalists listed on amion for assistance.

## 2021-01-07 NOTE — Evaluation (Signed)
Physical Therapy Evaluation and Discharge Patient Details Name: Brandy Miranda MRN: [id number] DOB: 05/31/1926 Today's Date: 01/07/2021  History of Present Illness  Pt is a 87 y.o. F who presents 01/06/2021 with episode of syncope. Lap work up showed BMP with potassium of 3.3, magnesium 1.6. Urinalysis with no evidence of UTI. CT head showed no acute abnormality. EKG showed new left bundle branch block. Significant PMH: regionally metastatic melanoma s/p resection, HTN, HLD.  Clinical Impression  Patient evaluated by Physical Therapy with no further acute PT needs identified. PTA, pt lives alone and is active; reports walking 2 miles/day. On PT evaluation, pt seems to be fairly close to baseline. Ambulating 300 feet with no assistive device and negotiated 7 steps without physical difficulty. Of note, does display irregular HR with activity, 95-135 bpm, SpO2 97% on RA, BP 143/60 post mobility. Education provided regarding warm up/cool down, exercise recommendations, and monitoring of signs/symptoms during exertion. All education has been completed and the patient has no further questions. No follow-up Physical Therapy or equipment needs. PT is signing off. Thank you for this referral.      Recommendations for follow up therapy are one component of a multi-disciplinary discharge planning process, led by the attending physician.  Recommendations may be updated based on patient status, additional functional criteria and insurance authorization.  Follow Up Recommendations No PT follow up    Assistance Recommended at Discharge None  Functional Status Assessment Patient has not had a recent decline in their functional status  Equipment Recommendations  None recommended by PT    Recommendations for Other Services       Precautions / Restrictions Precautions Precautions: Other (comment) Precaution Comments: watch HR Restrictions Weight Bearing Restrictions: No      Mobility  Bed Mobility                General bed mobility comments: Standing in room upon arrival    Transfers Overall transfer level: Independent Equipment used: None                    Ambulation/Gait Ambulation/Gait assistance: Modified independent (Device/Increase time) Gait Distance (Feet): 300 Feet Assistive device: None   Gait velocity: decreased   General Gait Details: Pronated left foot, likely baseline  Stairs Stairs: Yes Stairs assistance: Modified independent (Device/Increase time) Stair Management: One rail Right Number of Stairs: 7 General stair comments: step over step pattern  Wheelchair Mobility    Modified Rankin (Stroke Patients Only)       Balance Overall balance assessment: Mild deficits observed, not formally tested                                           Pertinent Vitals/Pain Pain Assessment: No/denies pain    Home Living Family/patient expects to be discharged to:: Private residence Living Arrangements: Alone Available Help at Discharge: Family Type of Home: House         Home Layout: Laundry or work area in basement        Prior Function Prior Level of Function : Independent/Modified Independent             Mobility Comments: Walks 2 miles/day       Hand Dominance        Extremity/Trunk Assessment   Upper Extremity Assessment Upper Extremity Assessment: Overall WFL for tasks assessed    Lower Extremity Assessment Lower  Extremity Assessment: Overall WFL for tasks assessed    Cervical / Trunk Assessment Cervical / Trunk Assessment: Normal  Communication   Communication: No difficulties  Cognition Arousal/Alertness: Awake/alert Behavior During Therapy: WFL for tasks assessed/performed Overall Cognitive Status: Within Functional Limits for tasks assessed                                          General Comments      Exercises     Assessment/Plan    PT Assessment Patient does not  need any further PT services  PT Problem List         PT Treatment Interventions      PT Goals (Current goals can be found in the Care Plan section)  Acute Rehab PT Goals Patient Stated Goal: determine what cause syncopal episode PT Goal Formulation: All assessment and education complete, DC therapy    Frequency     Barriers to discharge        Co-evaluation               AM-PAC PT "6 Clicks" Mobility  Outcome Measure Help needed turning from your back to your side while in a flat bed without using bedrails?: None Help needed moving from lying on your back to sitting on the side of a flat bed without using bedrails?: None Help needed moving to and from a bed to a chair (including a wheelchair)?: None Help needed standing up from a chair using your arms (e.g., wheelchair or bedside chair)?: None Help needed to walk in hospital room?: None Help needed climbing 3-5 steps with a railing? : None 6 Click Score: 24    End of Session   Activity Tolerance: Patient tolerated treatment well Patient left: in bed;with call bell/phone within reach;with family/visitor present Nurse Communication: Mobility status PT Visit Diagnosis: Difficulty in walking, not elsewhere classified (R26.2)    Time: 0973-5329 PT Time Calculation (min) (ACUTE ONLY): 21 min   Charges:   PT Evaluation $PT Eval Low Complexity: [place], PT, DPT Acute Rehabilitation Services Pager [phone] Office [phone]   Deno Etienne 01/07/2021, 10:27 AM

## 2021-01-08 ENCOUNTER — Observation Stay (HOSPITAL_BASED_OUTPATIENT_CLINIC_OR_DEPARTMENT_OTHER): Payer: Medicare Other

## 2021-01-08 ENCOUNTER — Observation Stay (HOSPITAL_COMMUNITY): Payer: Medicare Other

## 2021-01-08 DIAGNOSIS — I350 Nonrheumatic aortic (valve) stenosis: Secondary | ICD-10-CM | POA: Diagnosis not present

## 2021-01-08 DIAGNOSIS — R55 Syncope and collapse: Secondary | ICD-10-CM | POA: Diagnosis not present

## 2021-01-08 LAB — MAGNESIUM: Magnesium: 1.9 mg/dL (ref 1.7–2.4)

## 2021-01-08 LAB — BASIC METABOLIC PANEL
Anion gap: 6 (ref 5–15)
BUN: 16 mg/dL (ref 8–23)
CO2: 24 mmol/L (ref 22–32)
Calcium: 8.2 mg/dL — ABNORMAL LOW (ref 8.9–10.3)
Chloride: 108 mmol/L (ref 98–111)
Creatinine, Ser: 0.66 mg/dL (ref 0.44–1.00)
GFR, Estimated: >60 mL/min (ref >60)
Glucose, Bld: 85 mg/dL (ref 70–99)
Potassium: 3.9 mmol/L (ref 3.5–5.1)
Sodium: 138 mmol/L (ref 135–145)

## 2021-01-08 LAB — ECHOCARDIOGRAM LIMITED
AR max vel: 1.48 cm2
AV Area VTI: 1.41 cm2
AV Area mean vel: 1.27 cm2
AV Mean grad: 17 mmHg
AV Peak grad: 24.6 mmHg
Ao pk vel: 2.48 m/s
Height: 62 in
Weight: 2003.2 oz

## 2021-01-08 LAB — GLUCOSE, CAPILLARY: Glucose-Capillary: 88 mg/dL (ref 70–99)

## 2021-01-08 NOTE — Discharge Summary (Signed)
Physician Discharge Summary  Brandy Miranda:[id number] DOB: 07/27/1926 DOA: 01/06/2021  PCP: Shon Baton, MD  Admit date: 01/06/2021 Discharge date: 01/08/2021  Admitted From: home Disposition:  home  Recommendations for Outpatient Follow-up:  Follow up with PCP in 1-2 weeks Please obtain BMP/CBC in one week Please consider ambulatory heart monitoring to complete evaluation for possible dysrhythmias as cause of syncope / unresponsive episode.  Home Health: No  Equipment/Devices: None  Discharge Condition: Sta[place]  CODE STATUS: DNR  Diet recommendation: Heart Healthy     Discharge Diagnoses: Principal Problem:   Syncope and collapse Active Problems:   Malignant melanoma of lower back (HCC)   HTN (hypertension)   HLD (hyperlipidemia)   Hypokalemia   Hypomagnesemia   LBBB (left bundle branch block)   Leukocytosis    Summary of HPI and Hospital Course:  87 y.o. 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     See ED RN's note of 01/06/21 8:05 PM for description by those who witnessed the episode.   Episode of Unresponsiveness - unclear if syncope or absence seizure vs cardiogenic - episode while having nails done, was reportedly unresponsive and appeared to be staring off into space for about 5 minutes.  Witnesses report labored appearing breathing during the episode.   Case discussed with neurology, recommended proceeding with EEG and agreed with MRI brain. MRI brain unremarkable, specifically no metastatic disease. EEG was normal. Orthostatic vitals normal. Echo EF 60-65%, mild LVH, grade 1 diastolic dysfunction, mild-mod aortic stenosis.  Etiology of this episode unclear, but likely vasovagal etiology vs less likely absence seizure.  Patient had no further similar episodes during admission.       Hypokalemia / Hypomagnesemia - replaced K and Mg on admission.  Monitor and replace as needed.  Goal K>4, Mg>2.   Left bundle branch block - new, seen on admission EKG, not on prior EKG's.  K and Mg replaced. No chest pain, troponin negative x 2. Discussed with cardiologist on call, no ischemic or other cardiac evaluation recommended.   Leukocytosis - WBC 11.3k on admission. Afebrile.   UA and CXR negative.   Patient without any infectious symptoms.   Suspect reactive   Hypertension - continued on home amlodipine and HCTZ, and ARB.   Hyperlipidemia - continue atorvastatin   Metastatic melanoma - regionally metastatic, no know distant mets.  S/p multiple resections.  On adjuvant nivolumab which has been on hold due to side effects.   MRI brain negative for metastatic disease.   Discharge Instructions   Discharge Instructions     Call MD for:   Complete by: As directed    Any more episodes of unresponsiveness. Chest pain or pressure. Worsening generalized weakness or fatigue. Blood pressure dropping low (less than 110/60) or staying too high (above 160/90)   Call MD for:  extreme fatigue   Complete by: As directed    Call MD for:  persistant dizziness or light-headedness   Complete by: As directed    Call MD for:  persistant nausea and vomiting   Complete by: As directed    Call MD for:  severe uncontrolled pain   Complete by: As directed    Call MD for:  temperature >100.4   Complete by: As directed    Diet - low sodium heart healthy   Complete by: As directed    Discharge instructions  Complete by: As directed    We have done a thorough evaluation to determine the cause of your episode of unresponsiveness.  We have not found a definite reason why this happened.  It is most likely that you had a vaso-vagal episode, which is the most common type of fainting, or that your blood pressure temporarily dropped low.  Your MRI brain did not show any spread of melanoma to  the brain (this can cause seizures), and your EEG was normal, without any sign of seizure activity.    You have some narrowing of the Aortic valve in your heart.  This is common in patient's your age.   This can sometimes lead to passing out, especially if you are dehydrated. Otherwise your heart is functioning very well.    Abnormal heart rhythms can cause passing out, but we have not seen anything while monitoring your heart here in the hospital.    Recommend your primary care provider consider doing a long-term cardiac monitor (2 to 4 weeks), to see if any abnormal heart rhythms can be found.   --Dr. Nicole Kindred, DO   [facility]   Increase activity slowly   Complete by: As directed       Allergies as of 01/08/2021   No Known Allergies      Medication List     STOP taking these medications    traMADol 50 MG tablet Commonly known as: ULTRAM       TAKE these medications    amLODipine 10 MG tablet Commonly known as: NORVASC Take 10 mg by mouth daily.   aspirin EC 81 MG tablet Take 81 mg by mouth every Sunday.   atorvastatin 20 MG tablet Commonly known as: LIPITOR Take 20 mg by mouth daily.   hydrALAZINE 10 MG tablet Commonly known as: APRESOLINE Take 10 mg by mouth daily as needed (if Systolic B/P is 017 or greater).   hydrochlorothiazide 25 MG tablet Commonly known as: HYDRODIURIL Take 25 mg by mouth daily.   olmesartan 40 MG tablet Commonly known as: BENICAR Take 40 mg by mouth daily.   OVER THE COUNTER MEDICATION Take 3 tablets by mouth daily. DoTerra - bone nutrient supplement   OVER THE COUNTER MEDICATION Take 1 tablet by mouth daily. DoTerra supplement - omega's, etc.   OVER THE COUNTER MEDICATION Take 1 tablet by mouth daily. DoTerra supplement - alpha crs   OVER THE COUNTER MEDICATION Take 1 capsule by mouth daily. DoTerra - microplex   PATADAY OP Place 1 drop into both eyes in the morning and at bedtime.   VITAMIN C PO Take  2 tablets by mouth daily.        No Known Allergies   If you experience worsening of your admission symptoms, develop shortness of breath, life threatening emergency, suicidal or homicidal thoughts you must seek medical attention immediately by calling 911 or calling your MD immediately  if symptoms less severe.    Please note   You were cared for by a hospitalist during your hospital stay. If you have any questions about your discharge medications or the care you received while you were in the hospital after you are discharged, you can call the unit and asked to speak with the hospitalist on call if the hospitalist that took care of you is not available. Once you are discharged, your primary care physician will handle any further medical issues. Please note that NO REFILLS for any discharge medications will be authorized once you are  discharged, as it is imperative that you return to your primary care physician (or establish a relationship with a primary care physician if you do not have one) for your aftercare needs so that they can reassess your need for medications and monitor your lab values.   Consultations:   None   Procedures/Studies: DG Chest 2 View  Result Date: 01/06/2021 CLINICAL DATA:  87 year old female with history of syncope. EXAM: CHEST - 2 VIEW COMPARISON:  10/29/2019 FINDINGS: The mediastinal contours are within normal limits. No cardiomegaly. The lungs are clear bilaterally without evidence of focal consolidation, pleural effusion, or pneumothorax. No acute osseous abnormality. IMPRESSION: No acute cardiopulmonary process. Electronically Signed   By: Ruthann Cancer M.D.   On: 01/06/2021 14:51   CT Head Wo Contrast  Result Date: 01/06/2021 CLINICAL DATA:  Syncopal episode of 3-5 minutes duration at salon, no fall, lightheadedness, incontinence, history hypertension, melanoma EXAM: CT HEAD WITHOUT CONTRAST TECHNIQUE: Contiguous axial images were obtained from the base of  the skull through the vertex without intravenous contrast. COMPARISON:  None. FINDINGS: Brain: Generalized atrophy. Normal ventricular morphology. No midline shift or mass effect. Small vessel chronic ischemic changes of deep cerebral Ingerson matter. No intracranial hemorrhage, mass lesion, evidence of acute infarction, or extra-axial fluid collection. Vascular: No hyperdense vessels. Minimal atherosclerotic calcification of internal carotid arteries at skull base Skull: Intact Sinuses/Orbits: Clear Other: N/A IMPRESSION: Atrophy with small vessel chronic ischemic changes of deep cerebral Frady matter. No acute intracranial abnormalities. Electronically Signed   By: Lavonia Dana M.D.   On: 01/06/2021 14:35   MR BRAIN W WO CONTRAST  Result Date: 01/07/2021 CLINICAL DATA:  Syncope.  History of melanoma. EXAM: MRI HEAD WITHOUT AND WITH CONTRAST TECHNIQUE: Multiplanar, multiecho pulse sequences of the brain and surrounding structures were obtained without and with intravenous contrast. CONTRAST:  5.46mL GADAVIST GADOBUTROL 1 MMOL/ML IV SOLN COMPARISON:  Head CT 01/06/2021 FINDINGS: Brain: There is no evidence of an acute infarct, mass, midline shift, or extra-axial fluid collection. Patchy T2 hyperintensities in the cerebral Bonito matter bilaterally are nonspecific but compatible with mild-to-moderate chronic small vessel ischemic disease. Mild cerebral atrophy is within normal limits for age. A single chronic microhemorrhage is noted in the right parietal lobe. No abnormal enhancement is identified. Vascular: Major intracranial vascular flow voids are preserved. Skull and upper cervical spine: No suspicious marrow lesion. Sinuses/Orbits: Bilateral cataract extraction. Mild mucosal thickening in the maxillary sinuses. Clear mastoid air cells. Other: None. IMPRESSION: 1. No acute intracranial abnormality or evidence of intracranial metastatic disease. 2. Mild-to-moderate chronic small vessel ischemic disease.  Electronically Signed   By: Logan Bores M.D.   On: 01/07/2021 16:27   EEG adult  Result Date: 01/08/2021 Willaim Rayas, MD     01/08/2021 12:15 PM [facility] Routine EEG Report Patient Name:   Brandy Miranda, Brandy Miranda Date of Birth:   12-21-1926 Identification Number:   MRN - [id number] Date of Study:   01/08/2021 11:57:58 Indication: Spells, Eval for Seizures, Technical Summary: A routine 20 channel electroencephalogram using the international 10-20 system of electrode placement was performed. Background: 9-10 Hz, Posterior dominant rhythm that attenuated with eye Opening States      Awake Activation Procedures Hyperventilation: Performed : Photic Stimulation: Classification: Normal : There were no Epileptiform discharges Diagnosis: Normal Awake study. There are no epileptiform discharges. Clinical Correlation: This is a normal study. The absence of interictal epileptiform abnormalities does not exclude the diagnosis of a seizure disorder. Dr Apolinar Junes [facility] [phone])  [phone] Case [id number]  ECHOCARDIOGRAM COMPLETE  Result Date: 01/07/2021    ECHOCARDIOGRAM REPORT   Patient Name:   Aleeza A Harrow Date of Exam: 01/07/2021 Medical Rec #:  [id number]    Height:       62.0 in Accession #:    [id number]   Weight:       125.5 lb Date of Birth:  1926/09/09    BSA:          1.568 m Patient Age:    50 years     BP:           119/77 mmHg Patient Gender: F            HR:           92 bpm. Exam Location:  Inpatient Procedure: 2D Echo, Cardiac Doppler and Color Doppler Indications:    R55 Syncope  History:        Patient has prior history of Echocardiogram examinations, most                 recent 04/11/2018. Risk Factors:Hypertension.  Sonographer:    Bernadene Person RDCS Referring Phys: [id number] [place]  1. Left ventricular ejection fraction, by estimation, is 60 to 65%. The left ventricle has normal function. The left ventricle has no regional wall motion  abnormalities. There is mild left ventricular hypertrophy of the basal-septal segment. Left ventricular diastolic parameters are consistent with Grade I diastolic dysfunction (impaired relaxation).  2. Right ventricular systolic function is normal. The right ventricular size is normal. There is mildly elevated pulmonary artery systolic pressure.  3. The mitral valve is normal in structure. No evidence of mitral valve regurgitation. No evidence of mitral stenosis.  4. The tricuspid valve is abnormal. Tricuspid valve regurgitation is moderate.  5. Aortic valve Dopplers are off angle likely leading to underestimation of gradients. By AVA and measured gradients there is mild AS. Visually the valve appears to have moderate to severe aortic stenosis. By planimetry area is 1.5 suggesting moderate stenosis. Consider repeat limited study to further evaluate aortic stenosis, particularly given patient presented with syncope. . The aortic valve is tricuspid. There is moderate calcification of the aortic valve. There is moderate thickening of the aortic valve. Aortic valve regurgitation is not visualized. FINDINGS  Left Ventricle: Left ventricular ejection fraction, by estimation, is 60 to 65%. The left ventricle has normal function. The left ventricle has no regional wall motion abnormalities. The left ventricular internal cavity size was normal in size. There is  mild left ventricular hypertrophy of the basal-septal segment. Left ventricular diastolic parameters are consistent with Grade I diastolic dysfunction (impaired relaxation). Normal left ventricular filling pressure. Right Ventricle: The right ventricular size is normal. No increase in right ventricular wall thickness. Right ventricular systolic function is normal. There is mildly elevated pulmonary artery systolic pressure. The tricuspid regurgitant velocity is 3.14  m/s, and with an assumed right atrial pressure of 3 mmHg, the estimated right ventricular systolic  pressure is 54.0 mmHg. Left Atrium: Left atrial size was normal in size. Right Atrium: Right atrial size was normal in size. Pericardium: There is no evidence of pericardial effusion. Mitral Valve: The mitral valve is normal in structure. There is mild thickening of the mitral valve leaflet(s). There is mild calcification of the mitral valve leaflet(s). Mild mitral annular calcification. No evidence of mitral valve regurgitation. No evidence of mitral valve stenosis. Tricuspid Valve: The tricuspid valve is abnormal. Tricuspid valve regurgitation is moderate .  No evidence of tricuspid stenosis. Aortic Valve: Aortic valve Dopplers are off angle likely leading to underestimation of gradients. By AVA and measured gradients there is mild AS. Visually the valve appears to have moderate to severe aortic stenosis. By planimetry area is 1.5 suggesting moderate stenosis. Consider repeat limited study to further evaluate aortic stenosis, particularly given patient presented with syncope. The aortic valve is tricuspid. There is moderate calcification of the aortic valve. There is moderate thickening of the aortic valve. There is moderate aortic valve annular calcification. Aortic valve regurgitation is not visualized. Aortic valve mean gradient measures 13.3 mmHg. Aortic valve peak gradient measures 21.9 mmHg. Aortic valve area, by VTI measures 1.82 cm. Pulmonic Valve: The pulmonic valve was not well visualized. Pulmonic valve regurgitation is mild. No evidence of pulmonic stenosis. Aorta: The aortic root is normal in size and structure. IAS/Shunts: No atrial level shunt detected by color flow Doppler.  LEFT VENTRICLE PLAX 2D LVIDd:         3.30 cm   Diastology LVIDs:         2.10 cm   LV e' medial:    6.24 cm/s LV PW:         1.00 cm   LV E/e' medial:  13.7 LV IVS:        1.20 cm   LV e' lateral:   6.41 cm/s LVOT diam:     2.00 cm   LV E/e' lateral: 13.3 LV SV:         86 LV SV Index:   55 LVOT Area:     3.14 cm  RIGHT  VENTRICLE RV S prime:     16.30 cm/s TAPSE (M-mode): 1.8 cm LEFT ATRIUM             Index        RIGHT ATRIUM          Index LA diam:        3.10 cm 1.98 cm/m   RA Area:     9.84 cm LA Vol (A2C):   38.9 ml 24.81 ml/m  RA Volume:   20.40 ml 13.01 ml/m LA Vol (A4C):   33.5 ml 21.36 ml/m LA Biplane Vol: 38.3 ml 24.42 ml/m  AORTIC VALVE AV Area (Vmax):    1.83 cm AV Area (Vmean):   1.79 cm AV Area (VTI):     1.82 cm AV Vmax:           233.80 cm/s AV Vmean:          170.168 cm/s AV VTI:            0.473 m AV Peak Grad:      21.9 mmHg AV Mean Grad:      13.3 mmHg LVOT Vmax:         136.00 cm/s LVOT Vmean:        97.200 cm/s LVOT VTI:          0.273 m LVOT/AV VTI ratio: 0.58  AORTA Ao Root diam: 3.30 cm Ao Asc diam:  3.70 cm MITRAL VALVE                TRICUSPID VALVE MV Area (PHT): 5.42 cm     TR Peak grad:   39.4 mmHg MV Decel Time: 140 msec     TR Vmax:        314.00 cm/s MV E velocity: 85.20 cm/s MV A velocity: 140.00 cm/s  SHUNTS MV E/A ratio:  0.61  Systemic VTI:  0.27 m                             Systemic Diam: 2.00 cm Carlyle Dolly MD Electronically signed by Carlyle Dolly MD Signature Date/Time: 01/07/2021/11:59:22 AM    Final    ECHOCARDIOGRAM LIMITED  Result Date: 01/08/2021    ECHOCARDIOGRAM LIMITED REPORT   Patient Name:   Tjuana A Erbe Date of Exam: 01/08/2021 Medical Rec #:  [id number]    Height:       62.0 in Accession #:    [id number]   Weight:       125.2 lb Date of Birth:  [birth year]    BSA:          1.567 m Patient Age:    41 years     BP:           161/76 mmHg Patient Gender: F            HR:           89 bpm. Exam Location:  Inpatient Procedure: Limited Echo, Limited Color Doppler and Cardiac Doppler Indications:    Aortic stenosis I35.0  History:        Patient has prior history of Echocardiogram examinations, most                 recent 01/07/2021. Risk Factors:Hypertension. Syncope.  Sonographer:    Darlina Sicilian RDCS Referring Phys: [id number] [place]  1.  Left ventricular ejection fraction, by estimation, is 60 to 65%. The left ventricle has normal function.  2. AVA by planimetry 1.4 cm sq. . There is moderate calcification of the aortic valve. There is moderate thickening of the aortic valve. Aortic valve regurgitation is not visualized. Mild to moderate aortic valve stenosis. Aortic valve mean gradient measures 17.0 mmHg. Aortic valve peak gradient measures 24.6 mmHg. Aortic valve area, by VTI measures 1.41 cm.   3. Limited echo to evaluate aortic stenosis FINDINGS  Left Ventricle: Left ventricular ejection fraction, by estimation, is 60 to 65%. The left ventricle has normal function. Aortic Valve: AVA by planimetry 1.4 cm sq. There is moderate calcification of the aortic valve. There is moderate thickening of the aortic valve. There is moderate aortic valve annular calcification. Aortic valve regurgitation is not visualized. Mild to moderate aortic stenosis is present. Aortic valve mean gradient measures 17.0 mmHg. Aortic valve peak gradient measures 24.6 mmHg. Aortic valve area, by VTI measures 1.41 cm. LEFT VENTRICLE PLAX 2D LVOT diam:     2.10 cm LV SV:         69 LV SV Index:   44 LVOT Area:     3.46 cm  AORTIC VALVE                     PULMONIC VALVE AV Area (Vmax):    1.48 cm      RVOT Peak grad: 6 mmHg AV Area (Vmean):   1.27 cm AV Area (VTI):     1.41 cm AV Vmax:           247.75 cm/s AV Vmean:          197.500 cm/s AV VTI:            0.492 m AV Peak Grad:      24.6 mmHg AV Mean Grad:      17.0 mmHg LVOT Vmax:  106.00 cm/s LVOT Vmean:        72.700 cm/s LVOT VTI:          0.200 m LVOT/AV VTI ratio: 0.41  AORTA Ao Asc diam: 3.20 cm  SHUNTS Systemic VTI:  0.20 m Systemic Diam: 2.10 cm Pulmonic VTI:  0.168 m Carlyle Dolly MD Electronically signed by Carlyle Dolly MD Signature Date/Time: 01/08/2021/1:07:40 PM    Final        Subjective: Pt feels well this AM.  Daughter at bedside.  Pt denies any dizziness/lightheadedness.  They are  agreeable to having EEG and MRI for further evaluation, given hx of melanoma as advised by neurologist.  Pt otherwise says she feels well and ready to get home.    Discharge Exam: Vitals:   01/07/21 1959 01/08/21 0415  BP: (!) 143/73 (!) 161/76  Pulse: 86 89  Resp: 19 18  Temp: 98 F (36.7 C) 98 F (36.7 C)  SpO2: 93% 93%   Vitals:   01/07/21 0557 01/07/21 1501 01/07/21 1959 01/08/21 0415  BP: 119/77 (!) 141/71 (!) 143/73 (!) 161/76  Pulse: 78  86 89  Resp: 15 18 19 18   Temp: 98.4 F (36.9 C) 98 F (36.7 C) 98 F (36.7 C) 98 F (36.7 C)  TempSrc: Oral Oral Oral Oral  SpO2: 93%  93% 93%  Weight: 56.9 kg   56.8 kg  Height:        General: Pt is alert, awake, not in acute distress Cardiovascular: RR, S1/S2 +, no rubs, no gallops Respiratory: CTA bilaterally, no wheezing, no rhonchi Abdominal: Soft, NT, ND, bowel sounds + Extremities: no edema, no cyanosis    The results of significant diagnostics from this hospitalization (including imaging, microbiology, ancillary and laboratory) are listed below for reference.     Microbiology: Recent Results (from the past 240 hour(s))  Resp Panel by RT-PCR (Flu A&B, Covid) Nasopharyngeal Swab     Status: None   Collection Time: 01/06/21  7:14 PM   Specimen: Nasopharyngeal Swab; Nasopharyngeal(NP) swabs in vial transport medium  Result Value Ref Range Status   SARS Coronavirus 2 by RT PCR NEGATIVE NEGATIVE Final    Comment: (NOTE) SARS-CoV-2 target nucleic acids are NOT DETECTED.  The SARS-CoV-2 RNA is generally detectable in upper respiratory specimens during the acute phase of infection. The lowest concentration of SARS-CoV-2 viral copies this assay can detect is 138 copies/mL. A negative result does not preclude SARS-Cov-2 infection and should not be used as the sole basis for treatment or other patient management decisions. A negative result may occur with  improper specimen collection/handling, submission of specimen  other than nasopharyngeal swab, presence of viral mutation(s) within the areas targeted by this assay, and inadequate number of viral copies(<138 copies/mL). A negative result must be combined with clinical observations, patient history, and epidemiological information. The expected result is Negative.  Fact Sheet for Patients:  EntrepreneurPulse.com.au  Fact Sheet for Healthcare Providers:  IncredibleEmployment.be  This test is no t yet approved or cleared by the [place] FDA and  has been authorized for detection and/or diagnosis of SARS-CoV-2 by FDA under an Emergency Use Authorization (EUA). This EUA will remain  in effect (meaning this test can be used) for the duration of the COVID-19 declaration under Section 564(b)(1) of the Act, 21 U.S.C.section 360bbb-3(b)(1), unless the authorization is terminated  or revoked sooner.       Influenza A by PCR NEGATIVE NEGATIVE Final   Influenza B by PCR NEGATIVE NEGATIVE Final  Comment: (NOTE) The Xpert Xpress SARS-CoV-2/FLU/RSV plus assay is intended as an aid in the diagnosis of influenza from Nasopharyngeal swab specimens and should not be used as a sole basis for treatment. Nasal washings and aspirates are unacceptable for Xpert Xpress SARS-CoV-2/FLU/RSV testing.  Fact Sheet for Patients: EntrepreneurPulse.com.au  Fact Sheet for Healthcare Providers: IncredibleEmployment.be  This test is not yet approved or cleared by the [place] FDA and has been authorized for detection and/or diagnosis of SARS-CoV-2 by FDA under an Emergency Use Authorization (EUA). This EUA will remain in effect (meaning this test can be used) for the duration of the COVID-19 declaration under Section 564(b)(1) of the Act, 21 U.S.C. section 360bbb-3(b)(1), unless the authorization is terminated or revoked.  Performed at [facility], [place] [address], [place],  [place] [address]      Labs: BNP (last 3 results) No results for input(s): BNP in the last 8760 hours. Basic Metabolic Panel: Recent Labs  Lab 01/06/21 1332 01/06/21 1348 01/07/21 0210 01/08/21 0147  NA 137  --  139 138  K 3.3*  --  3.7 3.9  CL 101  --  105 108  CO2 30  --  29 24  GLUCOSE 111*  --  114* 85  BUN 22  --  17 16  CREATININE 0.82  --  0.87 0.66  CALCIUM 9.3  --  8.4* 8.2*  MG  --  1.6* 2.1 1.9   Liver Function Tests: Recent Labs  Lab 01/07/21 0210  AST 17  ALT 13  ALKPHOS 40  BILITOT 0.6  PROT 5.6*  ALBUMIN 2.8*   No results for input(s): LIPASE, AMYLASE in the last 168 hours. No results for input(s): AMMONIA in the last 168 hours. CBC: Recent Labs  Lab 01/06/21 1332 01/07/21 0210  WBC 11.3* 6.7  NEUTROABS 9.6*  --   HGB 12.2 10.6*  HCT 37.3 32.7*  MCV 91.0 92.6  PLT 236 211   Cardiac Enzymes: No results for input(s): CKTOTAL, CKMB, CKMBINDEX, TROPONINI in the last 168 hours. BNP: Invalid input(s): POCBNP CBG: Recent Labs  Lab 01/06/21 1413 01/08/21 0641  GLUCAP 115* 88   D-Dimer No results for input(s): DDIMER in the last 72 hours. Hgb A1c No results for input(s): HGBA1C in the last 72 hours. Lipid Profile No results for input(s): CHOL, HDL, LDLCALC, TRIG, CHOLHDL, LDLDIRECT in the last 72 hours. Thyroid function studies No results for input(s): TSH, T4TOTAL, T3FREE, THYROIDAB in the last 72 hours.  Invalid input(s): FREET3 Anemia work up No results for input(s): VITAMINB12, FOLATE, FERRITIN, TIBC, IRON, RETICCTPCT in the last 72 hours. Urinalysis    Component Value Date/Time   COLORURINE YELLOW 01/06/2021 1520   APPEARANCEUR HAZY (A) 01/06/2021 1520   LABSPEC 1.017 01/06/2021 1520   PHURINE 5.0 01/06/2021 1520   GLUCOSEU NEGATIVE 01/06/2021 1520   HGBUR NEGATIVE 01/06/2021 1520   BILIRUBINUR NEGATIVE 01/06/2021 1520   KETONESUR NEGATIVE 01/06/2021 1520   PROTEINUR NEGATIVE 01/06/2021 1520   NITRITE NEGATIVE 01/06/2021 [place] 01/06/2021 1520   Sepsis Labs Invalid input(s): PROCALCITONIN,  WBC,  LACTICIDVEN Microbiology Recent Results (from the past 240 hour(s))  Resp Panel by RT-PCR (Flu A&B, Covid) Nasopharyngeal Swab     Status: None   Collection Time: 01/06/21  7:14 PM   Specimen: Nasopharyngeal Swab; Nasopharyngeal(NP) swabs in vial transport medium  Result Value Ref Range Status   SARS Coronavirus 2 by RT PCR NEGATIVE NEGATIVE Final    Comment: (NOTE) SARS-CoV-2  target nucleic acids are NOT DETECTED.  The SARS-CoV-2 RNA is generally detectable in upper respiratory specimens during the acute phase of infection. The lowest concentration of SARS-CoV-2 viral copies this assay can detect is 138 copies/mL. A negative result does not preclude SARS-Cov-2 infection and should not be used as the sole basis for treatment or other patient management decisions. A negative result may occur with  improper specimen collection/handling, submission of specimen other than nasopharyngeal swab, presence of viral mutation(s) within the areas targeted by this assay, and inadequate number of viral copies(<138 copies/mL). A negative result must be combined with clinical observations, patient history, and epidemiological information. The expected result is Negative.  Fact Sheet for Patients:  EntrepreneurPulse.com.au  Fact Sheet for Healthcare Providers:  IncredibleEmployment.be  This test is no t yet approved or cleared by the [place] FDA and  has been authorized for detection and/or diagnosis of SARS-CoV-2 by FDA under an Emergency Use Authorization (EUA). This EUA will remain  in effect (meaning this test can be used) for the duration of the COVID-19 declaration under Section 564(b)(1) of the Act, 21 U.S.C.section 360bbb-3(b)(1), unless the authorization is terminated  or revoked sooner.       Influenza A by PCR NEGATIVE NEGATIVE Final   Influenza B  by PCR NEGATIVE NEGATIVE Final    Comment: (NOTE) The Xpert Xpress SARS-CoV-2/FLU/RSV plus assay is intended as an aid in the diagnosis of influenza from Nasopharyngeal swab specimens and should not be used as a sole basis for treatment. Nasal washings and aspirates are unacceptable for Xpert Xpress SARS-CoV-2/FLU/RSV testing.  Fact Sheet for Patients: EntrepreneurPulse.com.au  Fact Sheet for Healthcare Providers: IncredibleEmployment.be  This test is not yet approved or cleared by the [place] FDA and has been authorized for detection and/or diagnosis of SARS-CoV-2 by FDA under an Emergency Use Authorization (EUA). This EUA will remain in effect (meaning this test can be used) for the duration of the COVID-19 declaration under Section 564(b)(1) of the Act, 21 U.S.C. section 360bbb-3(b)(1), unless the authorization is terminated or revoked.  Performed at [facility], [place] [address] [place], [place] [address]      Time coordinating discharge: Over 30 minutes  SIGNED:   Ezekiel Slocumb, DO [facility] 01/08/2021, 1:37 PM   If 7PM-7AM, please contact night-coverage [email or web address]

## 2021-01-08 NOTE — Plan of Care (Signed)

## 2021-01-08 NOTE — Procedures (Signed)
   [facility]  Routine EEG Report  Patient Name:   Brandy Miranda, Brandy Miranda Date of Birth:   May 12, 1926 Identification Number:   MRN - [id number]  Date of Study:   01/08/2021 11:57:58  Indication: Spells, Eval for Seizures,  Technical Summary: A routine 20 channel electroencephalogram using the international 10-20 system of electrode placement was performed.  Background: 9-10 Hz, Posterior dominant rhythm that attenuated with eye Opening  States        Awake   Activation Procedures   Hyperventilation: Performed :  Photic Stimulation:  Classification: Normal : There were no Epileptiform discharges  Diagnosis: Normal Awake study. There are no epileptiform discharges.  Clinical Correlation: This is a normal study. The absence of interictal epileptiform abnormalities does not exclude the diagnosis of a seizure disorder.      Dr Apolinar Junes   [facility] [phone]   Case [id number]

## 2021-01-08 NOTE — Progress Notes (Signed)
  Echocardiogram 2D Echocardiogram limited has been performed.  Brandy Miranda 01/08/2021, 12:54 PM

## 2021-01-08 NOTE — Progress Notes (Signed)
EEG done at bedside. No skin breakdown noted. Results pending. 

## 2021-01-08 NOTE — Progress Notes (Signed)
Mobility Specialist Progress Note:   01/08/21 1300  Mobility  Activity Ambulated in hall  Level of Assistance Standby assist, set-up cues, supervision of patient - no hands on  Assistive Device None  Distance Ambulated (ft) 100 ft  Mobility Ambulated with assistance in hallway  Mobility Response Tolerated well  Mobility performed by Mobility specialist  $Mobility charge 1 Mobility   Pt received in room willing to participate in mobility. Complaints of right arch pain but otherwise asymptomatic. Pt returned to EOB with call bell in reach, all needs met and family member present.   [facility] and safety inspector Phone [phone]

## 2021-03-07 ENCOUNTER — Ambulatory Visit: Admit: 2021-03-07 | Discharge: 2021-03-07 | Payer: MEDICARE

## 2021-03-07 ENCOUNTER — Ambulatory Visit
Admit: 2021-03-07 | Discharge: 2021-03-07 | Payer: MEDICARE | Attending: Hematology & Oncology | Primary: Hematology & Oncology

## 2021-03-14 ENCOUNTER — Ambulatory Visit
Admit: 2021-03-14 | Discharge: 2021-03-15 | Payer: MEDICARE | Attending: Hematology & Oncology | Primary: Hematology & Oncology

## 2021-03-14 DIAGNOSIS — C4359 Malignant melanoma of other part of trunk: Principal | ICD-10-CM

## 2021-03-22 DIAGNOSIS — C4359 Malignant melanoma of other part of trunk: Principal | ICD-10-CM

## 2021-06-26 ENCOUNTER — Emergency Department (HOSPITAL_COMMUNITY): Payer: Medicare Other

## 2021-06-26 ENCOUNTER — Observation Stay (HOSPITAL_COMMUNITY)
Admission: EM | Admit: 2021-06-26 | Discharge: 2021-06-27 | Disposition: A | Discharge status: Home / Self Care | Payer: Medicare Other | Attending: Internal Medicine | Admitting: Internal Medicine

## 2021-06-26 ENCOUNTER — Other Ambulatory Visit: Payer: Self-pay

## 2021-06-26 ENCOUNTER — Observation Stay (HOSPITAL_COMMUNITY): Payer: Medicare Other

## 2021-06-26 DIAGNOSIS — I350 Nonrheumatic aortic (valve) stenosis: Secondary | ICD-10-CM

## 2021-06-26 DIAGNOSIS — R55 Syncope and collapse: Principal | ICD-10-CM | POA: Diagnosis present

## 2021-06-26 DIAGNOSIS — Z85828 Personal history of other malignant neoplasm of skin: Secondary | ICD-10-CM | POA: Diagnosis not present

## 2021-06-26 DIAGNOSIS — Z79899 Other long term (current) drug therapy: Secondary | ICD-10-CM | POA: Diagnosis not present

## 2021-06-26 DIAGNOSIS — Z7982 Long term (current) use of aspirin: Secondary | ICD-10-CM | POA: Insufficient documentation

## 2021-06-26 DIAGNOSIS — I447 Left bundle-branch block, unspecified: Secondary | ICD-10-CM | POA: Diagnosis present

## 2021-06-26 DIAGNOSIS — N181 Chronic kidney disease, stage 1: Secondary | ICD-10-CM | POA: Diagnosis not present

## 2021-06-26 DIAGNOSIS — I10 Essential (primary) hypertension: Secondary | ICD-10-CM | POA: Diagnosis present

## 2021-06-26 DIAGNOSIS — I129 Hypertensive chronic kidney disease with stage 1 through stage 4 chronic kidney disease, or unspecified chronic kidney disease: Secondary | ICD-10-CM | POA: Insufficient documentation

## 2021-06-26 DIAGNOSIS — R2681 Unsteadiness on feet: Secondary | ICD-10-CM | POA: Insufficient documentation

## 2021-06-26 LAB — COMPREHENSIVE METABOLIC PANEL
ALT: 14 U/L (ref 0–44)
AST: 20 U/L (ref 15–41)
Albumin: 3.5 g/dL (ref 3.5–5.0)
Alkaline Phosphatase: 44 U/L (ref 38–126)
Anion gap: 6 (ref 5–15)
BUN: 29 mg/dL — ABNORMAL HIGH (ref 8–23)
CO2: 27 mmol/L (ref 22–32)
Calcium: 9.4 mg/dL (ref 8.9–10.3)
Chloride: 106 mmol/L (ref 98–111)
Creatinine, Ser: 1 mg/dL (ref 0.44–1.00)
GFR, Estimated: 53 mL/min — ABNORMAL LOW (ref >60)
Glucose, Bld: 122 mg/dL — ABNORMAL HIGH (ref 70–99)
Potassium: 3.7 mmol/L (ref 3.5–5.1)
Sodium: 139 mmol/L (ref 135–145)
Total Bilirubin: 0.6 mg/dL (ref 0.3–1.2)
Total Protein: 6.7 g/dL (ref 6.5–8.1)

## 2021-06-26 LAB — URINALYSIS, ROUTINE W REFLEX MICROSCOPIC
Bilirubin Urine: NEGATIVE
Glucose, UA: NEGATIVE mg/dL
Hgb urine dipstick: NEGATIVE
Ketones, ur: NEGATIVE mg/dL
Leukocytes,Ua: NEGATIVE
Nitrite: NEGATIVE
Protein, ur: NEGATIVE mg/dL
Specific Gravity, Urine: 1.017 (ref 1.005–1.030)
pH: 5 (ref 5.0–8.0)

## 2021-06-26 LAB — CBC WITH DIFFERENTIAL/PLATELET
Abs Immature Granulocytes: 0.04 10*3/uL (ref 0.00–0.07)
Basophils Absolute: 0 10*3/uL (ref 0.0–0.1)
Basophils Relative: 0 %
Eosinophils Absolute: 0.1 10*3/uL (ref 0.0–0.5)
Eosinophils Relative: 1 %
HCT: 38.3 % (ref 36.0–46.0)
Hemoglobin: 12.7 g/dL (ref 12.0–15.0)
Immature Granulocytes: 0 %
Lymphocytes Relative: 21 %
Lymphs Abs: 1.9 10*3/uL (ref 0.7–4.0)
MCH: 30.8 pg (ref 26.0–34.0)
MCHC: 33.2 g/dL (ref 30.0–36.0)
MCV: 93 fL (ref 80.0–100.0)
Monocytes Absolute: 0.6 10*3/uL (ref 0.1–1.0)
Monocytes Relative: 7 %
Neutro Abs: 6.4 10*3/uL (ref 1.7–7.7)
Neutrophils Relative %: 71 %
Platelets: 248 10*3/uL (ref 150–400)
RBC: 4.12 MIL/uL (ref 3.87–5.11)
RDW: 13.2 % (ref 11.5–15.5)
WBC: 9.1 10*3/uL (ref 4.0–10.5)
nRBC: 0 % (ref 0.0–0.2)

## 2021-06-26 LAB — TROPONIN I (HIGH SENSITIVITY)
Troponin I (High Sensitivity): 6 ng/L (ref <18)
Troponin I (High Sensitivity): 5 ng/L (ref <18)

## 2021-06-26 LAB — MAGNESIUM: Magnesium: 1.9 mg/dL (ref 1.7–2.4)

## 2021-06-26 LAB — LIPASE, BLOOD: Lipase: 48 U/L (ref 11–51)

## 2021-06-26 MED ORDER — ENOXAPARIN SODIUM 30 MG/0.3ML IJ SOSY
30.0000 mg | PREFILLED_SYRINGE | Freq: Every day | INTRAMUSCULAR | Status: DC
  Filled 2021-06-26: qty 0.3

## 2021-06-26 MED ORDER — METOPROLOL TARTRATE 25 MG PO TABS
12.5000 mg | ORAL_TABLET | Freq: Two times a day (BID) | ORAL | Status: DC
Start: 2021-06-26 — End: 2021-06-27
  Administered 2021-06-26 – 2021-06-27 (×3): 12.5 mg via ORAL
  Filled 2021-06-26 (×3): qty 1

## 2021-06-26 MED ORDER — POTASSIUM CHLORIDE CRYS ER 20 MEQ PO TBCR
40.0000 meq | EXTENDED_RELEASE_TABLET | Freq: Once | ORAL | Status: AC
  Administered 2021-06-26: 40 meq via ORAL
  Filled 2021-06-26: qty 2

## 2021-06-26 MED ORDER — IRBESARTAN 300 MG PO TABS
300.0000 mg | ORAL_TABLET | Freq: Every day | ORAL | Status: DC
  Administered 2021-06-26 – 2021-06-27 (×2): 300 mg via ORAL
  Filled 2021-06-26 (×3): qty 1

## 2021-06-26 MED ORDER — SODIUM CHLORIDE 0.9% FLUSH
3.0000 mL | Freq: Two times a day (BID) | INTRAVENOUS | Status: DC
  Administered 2021-06-26: 3 mL via INTRAVENOUS

## 2021-06-26 MED ORDER — ATORVASTATIN CALCIUM 10 MG PO TABS
20.0000 mg | ORAL_TABLET | Freq: Every day | ORAL | Status: DC
  Administered 2021-06-26 – 2021-06-27 (×2): 20 mg via ORAL
  Filled 2021-06-26 (×2): qty 2

## 2021-06-26 MED ORDER — ASPIRIN EC 81 MG PO TBEC: 81.0000 mg | DELAYED_RELEASE_TABLET | ORAL | Status: DC

## 2021-06-26 MED ORDER — HYDROCHLOROTHIAZIDE 25 MG PO TABS
25.0000 mg | ORAL_TABLET | Freq: Every day | ORAL | Status: DC
  Administered 2021-06-26 – 2021-06-27 (×2): 25 mg via ORAL
  Filled 2021-06-26 (×2): qty 1

## 2021-06-26 MED ORDER — SODIUM CHLORIDE 0.9 % IV BOLUS
500.0000 mL | Freq: Once | INTRAVENOUS | Status: AC
  Administered 2021-06-26: 500 mL via INTRAVENOUS

## 2021-06-26 MED ORDER — MAGNESIUM SULFATE 2 GM/50ML IV SOLN
2.0000 g | Freq: Once | INTRAVENOUS | Status: AC
  Administered 2021-06-26: 2 g via INTRAVENOUS
  Filled 2021-06-26: qty 50

## 2021-06-26 MED ORDER — OLOPATADINE HCL 0.1 % OP SOLN
1.0000 [drp] | Freq: Two times a day (BID) | OPHTHALMIC | Status: DC
Start: 2021-06-26 — End: 2021-06-27
  Filled 2021-06-26: qty 5

## 2021-06-26 MED ORDER — AMLODIPINE BESYLATE 5 MG PO TABS
10.0000 mg | ORAL_TABLET | Freq: Every day | ORAL | Status: DC
  Administered 2021-06-26 – 2021-06-27 (×2): 10 mg via ORAL
  Filled 2021-06-26 (×2): qty 2

## 2021-06-26 MED ORDER — SODIUM PHOSPHATES 45 MMOLE/15ML IV SOLN
30.0000 mmol | Freq: Once | INTRAVENOUS | Status: AC
  Administered 2021-06-26: 30 mmol via INTRAVENOUS
  Filled 2021-06-26: qty 10

## 2021-06-26 NOTE — ED Notes (Signed)
Report given to clara, rn ?

## 2021-06-26 NOTE — ED Notes (Signed)
Pt ambulated to the restroom without assistance. Steady on feet.  ?

## 2021-06-26 NOTE — ED Triage Notes (Addendum)
Pt was at church mass this morning and the pastor told ems that she was sitting and then she "slumped" over. 911 called. Fire arrived first and they stated that she was awake when they got there but not responding to them just staring off. Ems stated at first she would not verbally respond but was following commands and improved enroute to hospital. ? ?Pt states she remembers being a church but does not remember passing out. And the first thing she remembers after was them taking her out of the church.  ?Does not report any dizziness before syncope but states that she felt a little weak this morning ? ?Pt states no significant cards hx, just a murmur and daughter reports dx with LBB last year.  ? ?Did have an episode like this last year ? ?

## 2021-06-26 NOTE — H&P (Signed)
?History and Physical  ? ? ?Brandy Miranda HYQ:[id number] DOB: March 28, 1926 DOA: 06/26/2021 ? ?PCP: Shon Baton, MD (Confirm with patient/family/NH records and if not entered, this has to be entered at [facility] point of entry) ?Patient coming from: Home ? ?I have personally briefly reviewed patient's old medical records in [place] ? ?Chief Complaint: I passed out ? ?HPI: Brandy Miranda is a 88 y.o. female with medical history significant of mild/moderate aortic stenosis echocardiogram 2022, HTN, HLD, CKD stage I, presented with syncope. ? ?Patient was sitting on the church bench to attend the mass did not start to feel lightheadedness and passed out.  Bystanders reported patient was staring and then became unresponsive and slumped over.  EMS arrived and found the patient in bradycardia, and awake but unresponsive.  And gradually patient recovered consciousness.  Cardiac monitoring strip showed frequent PVCs and doublets.  Patient remembered she did not sleep well last night, and felt lightheadedness this morning.  But no nauseous vomiting chest pain shortness of breath, no diarrhea this morning before coming to the hospital. ? ?ED Course: No tachycardia no hypotension.  Telemetry monitoring showed frequent PVCs with occasional couplets.  No bradycardia.  And patient has 1 bowel of large diarrhea in the ED. K 3.7, Mg=1.9.  Troponin negative x1.  EKG showed sinus rhythm, frequent PVCs. ? ?Review of Systems: As per HPI otherwise 14 point review of systems negative.  ? ? ?Past Medical History:  ?Diagnosis Date  ? Cancer [facility]) 2021  ? Melanoma, back  ? HOH (hard of hearing)   ? Wears bilateral hearing aids  ? Hypercholesteremia   ? Hypertension   ? ? ?Past Surgical History:  ?Procedure Laterality Date  ? MELANOMA EXCISION WITH SENTINEL LYMPH NODE BIOPSY Left 11/04/2019  ? Procedure: WIDE LOCAL EXCISION  LEFT UPPER BACK MELANOMA WITH ADVANCEMENT FLAP CLOSURE;  Surgeon: [name], MD;  Location: [place];  Service: General;   Laterality: Left;  ? SENTINEL NODE BIOPSY Left 11/04/2019  ? Procedure: SENTINEL NODE BIOPSY;  Surgeon: [name], MD;  Location: [place];  Service: General;  Laterality: Left;  ? ? ? reports that she has never smoked. She has never used smokeless tobacco. She reports current alcohol use. She reports that she does not use drugs. ? ?No Known Allergies ? ?Family History  ?Problem Relation Age of Onset  ? Hypertension Mother   ? ? ? ?Prior to Admission medications   ?Medication Sig Start Date End Date Taking? Authorizing Provider  ?amLODipine (NORVASC) 10 MG tablet Take 10 mg by mouth daily. 06/30/19   [provider]  ?Ascorbic Acid (VITAMIN C PO) Take 2 tablets by mouth daily.    [provider]  ?aspirin EC 81 MG tablet Take 81 mg by mouth every Sunday.    [provider]  ?atorvastatin (LIPITOR) 20 MG tablet Take 20 mg by mouth daily. 02/10/18   [provider]  ?hydrALAZINE (APRESOLINE) 10 MG tablet Take 10 mg by mouth daily as needed (if Systolic B/P is 952 or greater).    [provider]  ?hydrochlorothiazide (HYDRODIURIL) 25 MG tablet Take 25 mg by mouth daily. 01/20/18   [provider]  ?olmesartan (BENICAR) 40 MG tablet Take 40 mg by mouth daily. 01/13/18   [provider]  ?Olopatadine HCl (PATADAY OP) Place 1 drop into both eyes in the morning and at bedtime.    [provider]  ?OVER THE COUNTER MEDICATION Take 3 tablets by mouth daily. DoTerra -  bone nutrient supplement    [provider]  ?OVER THE COUNTER MEDICATION Take 1 tablet by mouth daily. DoTerra supplement - omega's, etc.     [provider]  ?OVER THE COUNTER MEDICATION Take 1 tablet by mouth daily. DoTerra supplement - alpha crs    [provider]  ?OVER THE COUNTER MEDICATION Take 1 capsule by mouth daily. DoTerra - microplex    [provider]  ? ? ?Physical Exam: ?Vitals:  ? 06/26/21 0849 06/26/21 0851  ?BP: (!) 149/59   ?Pulse: 69    ?Resp: 17   ?Temp: (!) 97.5 ?F (36.4 ?C)   ?TempSrc: Oral   ?SpO2: 97%   ?Weight:  57.2 kg  ?Height:  '5\' 1"'$  (1.549 m)  ? ? ?Constitutional: NAD, calm, comfortable ?Vitals:  ? 06/26/21 0849 06/26/21 0851  ?BP: (!) 149/59   ?Pulse: 69   ?Resp: 17   ?Temp: (!) 97.5 ?F (36.4 ?C)   ?TempSrc: Oral   ?SpO2: 97%   ?Weight:  57.2 kg  ?Height:  '5\' 1"'$  (1.549 m)  ? ?Eyes: PERRL, lids and conjunctivae normal ?ENMT: Mucous membranes are moist. Posterior pharynx clear of any exudate or lesions.Normal dentition.  ?Neck: normal, supple, no masses, no thyromegaly ?Respiratory: clear to auscultation bilaterally, no wheezing, no crackles. Normal respiratory effort. No accessory muscle use.  ?Cardiovascular: Regular rate and rhythm, loud systolic murmur on heart base. No extremity edema. 2+ pedal pulses. No carotid bruits.  ?Abdomen: no tenderness, no masses palpated. No hepatosplenomegaly. Bowel sounds positive.  ?Musculoskeletal: no clubbing / cyanosis. No joint deformity upper and lower extremities. Good ROM, no contractures. Normal muscle tone.  ?Skin: no rashes, lesions, ulcers. No induration ?Neurologic: CN 2-12 grossly intact. Sensation intact, DTR normal. Strength 5/5 in all 4.  ?Psychiatric: Normal judgment and insight. Alert and oriented x 3. Normal mood.  ? ? ? ?Labs on Admission: I have personally reviewed following labs and imaging studies ? ?CBC: ?Recent Labs  ?Lab 06/26/21 ?0911  ?WBC 9.1  ?NEUTROABS 6.4  ?HGB 12.7  ?HCT 38.3  ?MCV 93.0  ?PLT 248  ? ?Basic Metabolic Panel: ?Recent Labs  ?Lab 06/26/21 ?0911  ?NA 139  ?K 3.7  ?CL 106  ?CO2 27  ?GLUCOSE 122*  ?BUN 29*  ?CREATININE 1.00  ?CALCIUM 9.4  ?MG 1.9  ? ?GFR: ?Estimated Creatinine Clearance: 26.5 mL/min (by C-G formula based on SCr of 1 mg/dL). ?Liver Function Tests: ?Recent Labs  ?Lab 06/26/21 ?0911  ?AST 20  ?ALT 14  ?ALKPHOS 44  ?BILITOT 0.6  ?PROT 6.7  ?ALBUMIN 3.5  ? ?Recent Labs  ?Lab 06/26/21 ?0911  ?LIPASE 48  ? ?No results for input(s): AMMONIA in the last  168 hours. ?Coagulation Profile: ?No results for input(s): INR, PROTIME in the last 168 hours. ?Cardiac Enzymes: ?No results for input(s): CKTOTAL, CKMB, CKMBINDEX, TROPONINI in the last 168 hours. ?BNP (last 3 results) ?No results for input(s): PROBNP in the last 8760 hours. ?HbA1C: ?No results for input(s): HGBA1C in the last 72 hours. ?CBG: ?No results for input(s): GLUCAP in the last 168 hours. ?Lipid Profile: ?No results for input(s): CHOL, HDL, LDLCALC, TRIG, CHOLHDL, LDLDIRECT in the last 72 hours. ?Thyroid Function Tests: ?No results for input(s): TSH, T4TOTAL, FREET4, T3FREE, THYROIDAB in the last 72 hours. ?Anemia Panel: ?No results for input(s): VITAMINB12, FOLATE, FERRITIN, TIBC, IRON, RETICCTPCT in the last 72 hours. ?Urine analysis: ?   ?Component Value Date/Time  ? COLORURINE YELLOW 01/06/2021 1520  ? APPEARANCEUR HAZY (A) 01/06/2021  1520  ? LABSPEC 1.017 01/06/2021 1520  ? PHURINE 5.0 01/06/2021 1520  ? GLUCOSEU NEGATIVE 01/06/2021 1520  ? HGBUR NEGATIVE 01/06/2021 1520  ? [place] NEGATIVE 01/06/2021 1520  ? [place] NEGATIVE 01/06/2021 1520  ? PROTEINUR NEGATIVE 01/06/2021 1520  ? NITRITE NEGATIVE 01/06/2021 1520  ? LEUKOCYTESUR NEGATIVE 01/06/2021 1520  ? ? ?Radiological Exams on Admission: ?DG Chest 2 View ? ?Result Date: 06/26/2021 ?CLINICAL DATA:  Syncope EXAM: CHEST - 2 VIEW COMPARISON:  01/06/2021 FINDINGS: The heart size and mediastinal contours are within normal limits. Both lungs are clear. No pleural effusion. No acute osseous abnormality. IMPRESSION: No acute process in the chest. Electronically Signed   By: Macy Mis M.D.   On: 06/26/2021 09:33  ? ?CT Head Wo Contrast ? ?Result Date: 06/26/2021 ?CLINICAL DATA:  Mental status change, unknown cause EXAM: CT HEAD WITHOUT CONTRAST TECHNIQUE: Contiguous axial images were obtained from the base of the skull through the vertex without intravenous contrast. RADIATION DOSE REDUCTION: This exam was performed according to the departmental  dose-optimization program which includes automated exposure control, adjustment of the mA and/or kV according to patient size and/or use of iterative reconstruction technique. COMPARISON:  01/06/2021 [place]

## 2021-06-26 NOTE — Consult Note (Signed)
?Cardiology Consultation:  ? ?Patient ID: Brandy Miranda ?MRN: [id number]; DOB: 1926/11/04 ? ?Admit date: 06/26/2021 ?Date of Consult: 06/26/2021 ? ?PCP:  Shon Baton, MD ?  ?[facility] Providers ?Cardiologist:  None      ? ? ?Patient Profile:  ? ?Brandy Miranda is a 88 y.o. female who is being seen 06/26/2021 for the evaluation of syncope at the request of Dr Roosevelt Locks. ? ?History of Present Illness:  ? ?Brandy Miranda is a 88 year old woman who presented yesterday after a syncopal episode at church.  She describes feeling lightheaded and then she became unresponsive and slumped over.  She gradually regained consciousness.  There were no profound arrhythmias and she did not require CPR or shocks.  She has been noted to have frequent PVCs on monitoring.  She has a known history of mild to moderate aortic stenosis.  Her last echocardiogram was in November 2022 showing normal LV function with LVEF 60 to 65%, planimeter aortic valve area of 1.4 cm?, moderate aortic valve calcification with moderate thickening and mild to moderate aortic stenosis with a mean transvalvular gradient of 17 mmHg, peak gradient 25 mmHg, and aortic valve area of 1.4 cm?Marland Kitchen  Aortic valve dimensionless index was 0.41. ? ?The patient's daughter is at the bedside.  The patient is a very good historian.  She is an active elderly woman who walks 2 miles per day.  She lives independently in does all of her household chores without limitation.  She was at church this morning for a service at 7:30 AM.  She did not eat or drink anything prior to church.  She did not take her medications.  She began to feel dizzy and lightheaded and then has poor recollection of the events thereafter.  It is described that she slumped over and EMS was called.  There was no seizure activity reported.  She did not lose control of her bowels or bladder.  She did not bite her tongue.  She denies any recent chest pain or pressure.  She denies shortness of breath but has complained of  increased fatigue.  No other cardiac-related complaints voiced.  No palpitations, edema, orthopnea, or PND. ? ? ?Past Medical History:  ?Diagnosis Date  ? Cancer [facility]) 2021  ? Melanoma, back  ? HOH (hard of hearing)   ? Wears bilateral hearing aids  ? Hypercholesteremia   ? Hypertension   ? ? ?Past Surgical History:  ?Procedure Laterality Date  ? MELANOMA EXCISION WITH SENTINEL LYMPH NODE BIOPSY Left 11/04/2019  ? Procedure: WIDE LOCAL EXCISION  LEFT UPPER BACK MELANOMA WITH ADVANCEMENT FLAP CLOSURE;  Surgeon: [name], MD;  Location: [place];  Service: General;  Laterality: Left;  ? SENTINEL NODE BIOPSY Left 11/04/2019  ? Procedure: SENTINEL NODE BIOPSY;  Surgeon: [name], MD;  Location: [place];  Service: General;  Laterality: Left;  ?  ? ?Home Medications:  ?Prior to Admission medications   ?Medication Sig Start Date End Date Taking? Authorizing Provider  ?amLODipine (NORVASC) 10 MG tablet Take 10 mg by mouth daily. 06/30/19  Yes [provider]  ?hydrochlorothiazide (HYDRODIURIL) 25 MG tablet Take 25 mg by mouth daily. 01/20/18  Yes [provider]  ?olmesartan (BENICAR) 40 MG tablet Take 40 mg by mouth daily. 01/13/18  Yes [provider]  ?Olopatadine HCl (PATADAY OP) Place 1 drop into both eyes in the morning and at bedtime.   Yes [provider]  ?OVER THE COUNTER MEDICATION Take 3 tablets by mouth daily. DoTerra -  bone nutrient supplement   Yes [provider]  ?OVER THE COUNTER MEDICATION Take 1 capsule by mouth daily. DoTerra - microplex   Yes [provider]  ?Ascorbic Acid (VITAMIN C PO) Take 2 tablets by mouth daily. ?Patient not taking: Reported on 06/26/2021    [provider]  ?aspirin EC 81 MG tablet Take 81 mg by mouth every Sunday. ?Patient not taking: Reported on 06/26/2021    [provider]  ?atorvastatin (LIPITOR) 20 MG tablet Take 20 mg by mouth daily. ?Patient not taking: Reported on 06/26/2021 02/10/18   [provider]  ?hydrALAZINE (APRESOLINE) 10 MG tablet Take 10 mg by mouth daily as needed (if Systolic B/P is 401 or greater). ?Patient not taking: Reported on 06/26/2021    [provider]  ? ? ?Inpatient Medications: ?Scheduled Meds: ? amLODipine  10 mg Oral Daily  ? [START ON 07/02/2021] aspirin EC  81 mg Oral Q Sun  ? atorvastatin  20 mg Oral Daily  ? enoxaparin (LOVENOX) injection  30 mg Subcutaneous Daily  ? hydrochlorothiazide  25 mg Oral Daily  ? irbesartan  300 mg Oral Daily  ? metoprolol tartrate  12.5 mg Oral BID  ? olopatadine  1 drop Both Eyes BID  ? potassium chloride  40 mEq Oral Once  ? sodium chloride flush  3 mL Intravenous Q12H  ? ?Continuous Infusions: ? sodium phosphate  Dextrose 5% IVPB    ? ?PRN Meds: ? ? ?Allergies:   No Known Allergies ? ?Social History:   ?Social History  ? ?Socioeconomic History  ? Marital status: Widowed  ?  Spouse name: Not on file  ? Number of children: Not on file  ? Years of education: Not on file  ? Highest education level: Not on file  ?Occupational History  ? Not on file  ?Tobacco Use  ? Smoking status: Never  ? Smokeless tobacco: Never  ?Vaping Use  ? Vaping Use: Never used  ?Substance and Sexual Activity  ? Alcohol use: Yes  ?  Comment: occ  ? Drug use: Never  ? Sexual activity: Not on file  ?Other Topics Concern  ? Not on file  ?Social History Narrative  ? Not on file  ? ?Social Determinants of Health  ? ?Financial Resource Strain: Not on file  ?Food Insecurity: Not on file  ?Transportation Needs: Not on file  ?Physical Activity: Not on file  ?Stress: Not on file  ?Social Connections: Not on file  ?Intimate Partner Violence: Not on file  ?  ?Family History:   ?Family History  ?Problem Relation Age of Onset  ? Hypertension Mother   ?  ? ?ROS:  ?Please see the history of present illness.  ?All other ROS reviewed and negative.    ? ?Physical Exam/Data:  ? ?Vitals:  ? 06/26/21 1500 06/26/21 1515 06/26/21 1530 06/26/21 1600  ?BP: 130/61 (!) 122/58 (!) 148/65  132/67  ?Pulse: 68 66 71 70  ?Resp: 16 (!) 21 18 (!) 22  ?Temp:      ?TempSrc:      ?SpO2: 99% 97% 96% 99%  ?Weight:      ?Height:      ? ? ?Intake/Output Summary (Last 24 hours) at 06/26/2021 1635 ?Last data filed at 06/26/2021 1626 ?Gross per 24 hour  ?Intake 0 ml  ?Output --  ?Net 0 ml  ? ? ?  06/26/2021  ?  8:51 AM 01/08/2021  ?  4:15 AM 01/07/2021  ?  5:57 AM  ?  Last 3 Weights  ?Weight (lbs) 126 lb 125 lb 3.2 oz 125 lb 8 oz  ?Weight (kg) 57.153 kg 56.79 kg 56.926 kg  ?   ?Body mass index is 23.81 kg/m?.  ?General:  Well nourished, well developed, in no acute distress ?HEENT: normal ?Neck: no JVD ?Vascular: No carotid bruits; Distal pulses 2+ bilaterally ?Cardiac:  normal S1, S2; RRR; 3/6 harsh mid peaking crescendo decrescendo murmur at the right upper sternal border ?Lungs:  clear to auscultation bilaterally, no wheezing, rhonchi or rales  ?Abd: soft, nontender, no hepatomegaly  ?Ext: no edema ?Musculoskeletal:  No deformities, BUE and BLE strength normal and equal ?Skin: warm and dry  ?Neuro:  CNs 2-12 intact, no focal abnormalities noted ?Psych:  Normal affect  ? ?EKG:  The EKG was personally reviewed and demonstrates: Normal sinus rhythm with intermittent left bundle branch block and frequent PVCs, heart rate 74 bpm ? ?Telemetry:  Telemetry was personally reviewed and demonstrates: Normal sinus rhythm with frequent PVCs ? ?Relevant CV Studies: ?Echo 01/08/2021: ?IMPRESSIONS  ? ? ? 1. Left ventricular ejection fraction, by estimation, is 60 to 65%. The  ?left ventricle has normal function.  ? 2. AVA by planimetry 1.4 cm sq. . There is moderate calcification of the  ?aortic valve. There is moderate thickening of the aortic valve. Aortic  ?valve regurgitation is not visualized. Mild to moderate aortic valve  ?stenosis. Aortic valve mean gradient  ?measures 17.0 mmHg. Aortic valve peak gradient measures 24.6 mmHg. Aortic  ?valve area, by VTI measures 1.41 cm?.  ?   ? 3. Limited echo to evaluate aortic stenosis   ? ?FINDINGS  ? Left Ventricle: Left ventricular ejection fraction, by estimation, is 60  ?to 65%. The left ventricle has normal function.  ? ?Aortic Valve: AVA by planimetry 1.4 cm sq. There is moderate calcification

## 2021-06-26 NOTE — ED Notes (Signed)
Pt to xray

## 2021-06-26 NOTE — ED Provider Notes (Signed)
? ?Emergency Department Provider Note ? ? ?I have reviewed the triage vital signs and the nursing notes. ? ? ?HISTORY ? ?Chief Complaint ?Loss of Consciousness ? ? ?HPI ?Brandy Miranda is a 88 y.o. female with past medical history reviewed below presents emergency department after syncope event at church today.  Patient states that she did not eat breakfast this morning but that is typically the case for her before church.  She did take her home medications.  Apparently the priest saw her slumped over in the pew, bystanders went to assess, and 911 was called.  She was apparently somewhat confused and drowsy initially but improved in route with EMS.  Patient denies feeling any chest pain or palpitations.  She states she had some mild abdominal/epigastric cramping this morning but no nausea/vomiting or diarrhea.  She did have an admission in November of last year for syncope.  She does not see a cardiologist. No known head injury during this event.  ? ? ?Past Medical History:  ?Diagnosis Date  ? Cancer [facility]) 2021  ? Melanoma, back  ? HOH (hard of hearing)   ? Wears bilateral hearing aids  ? Hypercholesteremia   ? Hypertension   ? ? ?Review of Systems ? ?Constitutional: No fever/chills ?Cardiovascular: Denies chest pain. Positive syncope.  ?Respiratory: Denies shortness of breath. ?Gastrointestinal: No abdominal pain.  No nausea, no vomiting.  No diarrhea.  No constipation. ?Musculoskeletal: Negative for back pain. ?Skin: Negative for rash. ?Neurological: Negative for headaches, focal weakness or numbness. ? ? ?____________________________________________ ? ? ?PHYSICAL EXAM: ? ?VITAL SIGNS: ?ED Triage Vitals  ?Enc Vitals Group  ?   BP 06/26/21 0849 (!) 149/59  ?   Pulse Rate 06/26/21 0849 69  ?   Resp 06/26/21 0849 17  ?   Temp 06/26/21 0849 (!) 97.5 ?F (36.4 ?C)  ?   Temp Source 06/26/21 0849 Oral  ?   SpO2 06/26/21 0849 97 %  ?   Weight 06/26/21 0851 126 lb (57.2 kg)  ?   Height 06/26/21 0851 '5\' 1"'$  (1.549 m)   ? ?Constitutional: Alert and oriented. Well appearing and in no acute distress. ?Eyes: Conjunctivae are normal.  ?Head: Atraumatic. ?Nose: No congestion/rhinnorhea. ?Mouth/Throat: Mucous membranes are moist.   ?Neck: No stridor.   ?Cardiovascular: Normal rate, regular rhythm. Good peripheral circulation. Systolic AS type murmur noted.  ?Respiratory: Normal respiratory effort.  No retractions. Lungs CTAB. ?Gastrointestinal: Soft and nontender. No distention.  ?Musculoskeletal: No lower extremity tenderness nor edema. No gross deformities of extremities. ?Neurologic:  Normal speech and language. Normal strength and sensation in the bilateral upper/lower extremities.  ?Skin:  Skin is warm, dry and intact. No rash noted. ? ? ?____________________________________________ ?  ?LABS ?(all labs ordered are listed, but only abnormal results are displayed) ? ?Labs Reviewed  ?COMPREHENSIVE METABOLIC PANEL - Abnormal; Notable for the following components:  ?    Result Value  ? Glucose, Bld 122 (*)   ? BUN 29 (*)   ? GFR, Estimated 53 (*)   ? All other components within normal limits  ?BASIC METABOLIC PANEL - Abnormal; Notable for the following components:  ? Potassium 3.4 (*)   ? Calcium 8.6 (*)   ? All other components within normal limits  ?CBC WITH DIFFERENTIAL/PLATELET  ?MAGNESIUM  ?URINALYSIS, ROUTINE W REFLEX MICROSCOPIC  ?LIPASE, BLOOD  ?PHOSPHORUS  ?TSH  ?CBG MONITORING, ED  ?TROPONIN I (HIGH SENSITIVITY)  ?TROPONIN I (HIGH SENSITIVITY)  ? ?____________________________________________ ? ?EKG ? ? EKG Interpretation ? ?  Date/Time:  Monday June 26 2021 08:44:15 EDT ?Ventricular Rate:  74 ?PR Interval:  203 ?QRS Duration: 148 ?QT Interval:  469 ?QTC Calculation: 481 ?R Axis:   29 ?Text Interpretation: Sinus rhythm Multiform ventricular premature complexes IVCD, consider atypical LBBB Confirmed by Nanda Quinton [phone]) on 06/26/2021 9:28:44 AM ?  ? ?   ? ? ?____________________________________________ ? ? ?PROCEDURES ? ?Procedure(s) performed:  ? ?Procedures ? ?None  ?____________________________________________ ? ? ?INITIAL IMPRESSION / ASSESSMENT AND PLAN / ED COURSE ? ?Pertinent labs & imaging results that were available during my care of the patient were reviewed by me and considered in my medical decision making (see chart for details). ?  ?This patient is Presenting for Evaluation of syncope, which does require a range of treatment options, and is a complaint that involves a high risk of morbidity and mortality. ? ?The Differential Diagnoses include but not limited to symptomatic AS, arrhythmia, dehydration, hypoglycemia, vasovagal syncope. ? ?Critical Interventions-  ?  ?Medications  ?sodium chloride 0.9 % bolus 500 mL (0 mLs Intravenous Stopped 06/26/21 1626)  ?magnesium sulfate IVPB 2 g 50 mL (0 g Intravenous Stopped 06/26/21 1720)  ?sodium phosphate 30 mmol in dextrose 5 % 250 mL infusion (0 mmol Intravenous Stopped 06/27/21 0150)  ?potassium chloride SA (KLOR-CON M) CR tablet 40 mEq (40 mEq Oral Given 06/26/21 1635)  ?potassium chloride SA (KLOR-CON M) CR tablet 40 mEq (40 mEq Oral Given 06/27/21 1055)  ? ? ?Reassessment after intervention: Patient feeling much better.   ? ? ?I did obtain Additional Historical Information from daughter at bedside. ? ?I decided to review pertinent External Data, and in summary patient with admit in November 2022 for syncope. ECHO on that admit with mild/moderate AS.  ?  ?Clinical Laboratory Tests Ordered, included basic metabolic panel showing potassium of 3.4.  Troponin is within normal limits.  No UTI.  Magnesium is normal.  No anemia or leukocytosis on CBC ? ?Radiologic Tests Ordered, included CT head and CXR. I independently interpreted the images and agree with radiology interpretation.  ? ?Cardiac Monitor Tracing which shows NSR with occasional PVCs.  ? ? ?Social Determinants of Health Risk No smoking or  EtOH. ? ?Consult complete with Hospitalist. Plan for admit.  ? ?Medical Decision Making: Summary:  ?Patient presents emergency department for evaluation of syncope.  No clear prodrome.  Patient had a syncope admission 1 around 6 months ago with no clear etiology for symptoms.  He is awake and alert here.  Neuro intact.  I do appreciate a systolic murmur.  Plan for screening blood work, CT imaging of the head, chest x-ray and reassess.  Question if patient may benefit from period of monitoring and repeat echo.  ? ?Reevaluation with update and discussion with patient and family at bedside. Discussed results and plan for admit.  ? ?Disposition: admit ? ?____________________________________________ ? ?FINAL CLINICAL IMPRESSION(S) / ED DIAGNOSES ? ?Final diagnoses:  ?Syncope and collapse  ? ? ? ?NEW OUTPATIENT MEDICATIONS STARTED DURING THIS VISIT: ? ?Discharge Medication List as of 06/27/2021  3:36 PM  ?  ? ?START taking these medications  ? Details  ?metoprolol tartrate (LOPRESSOR) 25 MG tablet Take 0.5 tablets (12.5 mg total) by mouth 2 (two) times daily., Starting Tue 06/27/2021, Until Mon 09/25/2021, Normal  ?  ?  ? ? ?Note:  This document was prepared using Dragon voice recognition software and may include unintentional dictation errors. ? ?Nanda Quinton, MD, FACEP ?Emergency Medicine ? ?  ?Margette Fast, MD ?07/04/21  0742 ? ?

## 2021-06-26 NOTE — Progress Notes (Signed)
Telemetry monitoring continues to show frequent PVCs, gave IV magnesium and phosphorus while waiting for level.  Start patient on low-dose metoprolol 12.5 mg twice daily. ? ?Discussed with cardiology. ?

## 2021-06-26 NOTE — ED Notes (Signed)
Dinner tray delivered, pt eating at this time ? ?

## 2021-06-27 ENCOUNTER — Observation Stay (INDEPENDENT_AMBULATORY_CARE_PROVIDER_SITE_OTHER): Payer: Medicare Other

## 2021-06-27 ENCOUNTER — Encounter (HOSPITAL_COMMUNITY): Payer: Self-pay | Admitting: Internal Medicine

## 2021-06-27 ENCOUNTER — Other Ambulatory Visit: Payer: Self-pay | Admitting: Cardiology

## 2021-06-27 ENCOUNTER — Observation Stay (HOSPITAL_BASED_OUTPATIENT_CLINIC_OR_DEPARTMENT_OTHER): Payer: Medicare Other

## 2021-06-27 DIAGNOSIS — R55 Syncope and collapse: Secondary | ICD-10-CM

## 2021-06-27 DIAGNOSIS — I493 Ventricular premature depolarization: Secondary | ICD-10-CM

## 2021-06-27 DIAGNOSIS — I447 Left bundle-branch block, unspecified: Secondary | ICD-10-CM

## 2021-06-27 DIAGNOSIS — I350 Nonrheumatic aortic (valve) stenosis: Secondary | ICD-10-CM

## 2021-06-27 DIAGNOSIS — I10 Essential (primary) hypertension: Secondary | ICD-10-CM

## 2021-06-27 LAB — PHOSPHORUS: Phosphorus: 4.4 mg/dL (ref 2.5–4.6)

## 2021-06-27 LAB — BASIC METABOLIC PANEL
Anion gap: 6 (ref 5–15)
BUN: 21 mg/dL (ref 8–23)
CO2: 23 mmol/L (ref 22–32)
Calcium: 8.6 mg/dL — ABNORMAL LOW (ref 8.9–10.3)
Chloride: 109 mmol/L (ref 98–111)
Creatinine, Ser: 0.87 mg/dL (ref 0.44–1.00)
GFR, Estimated: >60 mL/min (ref >60)
Glucose, Bld: 89 mg/dL (ref 70–99)
Potassium: 3.4 mmol/L — ABNORMAL LOW (ref 3.5–5.1)
Sodium: 138 mmol/L (ref 135–145)

## 2021-06-27 LAB — ECHOCARDIOGRAM COMPLETE
AV Mean grad: 10 mmHg
AV Peak grad: 18.1 mmHg
Ao pk vel: 2.13 m/s
Area-P 1/2: 2.39 cm2
Height: 61 in
S' Lateral: 2.1 cm
Weight: 2016 oz

## 2021-06-27 LAB — CBG MONITORING, ED: Glucose-Capillary: 97 mg/dL (ref 70–99)

## 2021-06-27 LAB — TSH: TSH: 2.058 u[IU]/mL (ref 0.350–4.500)

## 2021-06-27 MED ORDER — METOPROLOL TARTRATE 25 MG PO TABS
12.5000 mg | ORAL_TABLET | Freq: Two times a day (BID) | ORAL | 2 refills | Status: DC
Start: 2021-06-27 — End: 2022-10-30

## 2021-06-27 MED ORDER — POTASSIUM CHLORIDE CRYS ER 20 MEQ PO TBCR
40.0000 meq | EXTENDED_RELEASE_TABLET | Freq: Once | ORAL | Status: AC
  Administered 2021-06-27: 40 meq via ORAL
  Filled 2021-06-27: qty 2

## 2021-06-27 NOTE — ED Notes (Signed)
Walked around hall with daughter. Gait steady. No signs of distress.  ?

## 2021-06-27 NOTE — Progress Notes (Signed)
PT Cancellation Note ? ?Patient Details ?Name: Brandy Miranda ?MRN: [id number] ?DOB: 10/21/1926 ? ? ?Cancelled Treatment:    Reason Eval/Treat Not Completed: Other (comment).  Declining PT and encouraged pt to let nursing know if she is feeling like a therapy eval would be helpful.  Discharging PT for now. ? ? ?Ramond Dial ?06/27/2021, 11:48 AM. ? ?Mee Hives, PT PhD ?Acute Rehab Dept. Number: [facility] [phone] and [place] [phone] ? ?

## 2021-06-27 NOTE — Evaluation (Signed)
Occupational Therapy Evaluation and Discharge ?Patient Details ?Name: Brandy Miranda ?MRN: [id number] ?DOB: 1926/07/23 ?Today's Date: 06/27/2021 ? ? ?History of Present Illness Pt is a 88 y/o female presenting 4/24 with syncope.  PMH includes: aortic stenosis, HTN, CKD stage 1, melanoma.  ? ?Clinical Impression ?  ?PTA patient independent and driving. Admitted for above and presents at baseline independent for for ADLs, mobility and transfers. VSS. Discussed safety and positional changes with increased time, reports typically a little light headed in the mornings at baseline.  No further OT needs have been identified and OT will sign off.  ?   ? ?Recommendations for follow up therapy are one component of a multi-disciplinary discharge planning process, led by the attending physician.  Recommendations may be updated based on patient status, additional functional criteria and insurance authorization.  ? ?Follow Up Recommendations ? No OT follow up  ?  ?Assistance Recommended at Discharge PRN  ?Patient can return home with the following   ? ?  ?Functional Status Assessment ? Patient has not had a recent decline in their functional status  ?Equipment Recommendations ? None recommended by OT  ?  ?Recommendations for Other Services   ? ? ?  ?Precautions / Restrictions Precautions ?Precautions: None ?Restrictions ?Weight Bearing Restrictions: No  ? ?  ? ?Mobility Bed Mobility ?Overal bed mobility: Independent ?  ?  ?  ?  ?  ?  ?  ?  ? ?Transfers ?Overall transfer level: Independent ?  ?  ?  ?  ?  ?  ?  ?  ?  ?  ? ?  ?Balance Overall balance assessment: No apparent balance deficits (not formally assessed) ?  ?  ?  ?  ?  ?  ?  ?  ?  ?  ?  ?  ?  ?  ?  ?  ?  ?  ?   ? ?ADL either performed or assessed with clinical judgement  ? ?ADL Overall ADL's : Independent ?  ?  ?  ?  ?  ?  ?  ?  ?  ?  ?  ?  ?  ?  ?  ?  ?  ?  ?  ?   ? ? ? ?Vision   ?Vision Assessment?: No apparent visual deficits  ?   ?Perception   ?  ?Praxis   ?  ? ?Pertinent  Vitals/Pain Pain Assessment ?Pain Assessment: No/denies pain  ? ? ? ?Hand Dominance Right ?  ?Extremity/Trunk Assessment Upper Extremity Assessment ?Upper Extremity Assessment: Overall WFL for tasks assessed ?  ?Lower Extremity Assessment ?Lower Extremity Assessment: Overall WFL for tasks assessed ?  ?  ?  ?Communication Communication ?Communication: No difficulties ?  ?Cognition Arousal/Alertness: Awake/alert ?Behavior During Therapy: [facility] for tasks assessed/performed ?Overall Cognitive Status: Within Functional Limits for tasks assessed ?  ?  ?  ?  ?  ?  ?  ?  ?  ?  ?  ?  ?  ?  ?  ?  ?  ?  ?  ?General Comments  VSS ? ?  ?Exercises   ?  ?Shoulder Instructions    ? ? ?Home Living Family/patient expects to be discharged to:: Private residence ?Living Arrangements: Alone ?Available Help at Discharge: Family ?Type of Home: House ?Home Access: Stairs to enter ?Entrance Stairs-Number of Steps: 4 +4 ?Entrance Stairs-Rails: Can reach both ?Home Layout: Two level;Laundry or work area in basement;Full bath on main level ?Alternate Level Stairs-Number of Steps: 10 ?  ?  Bathroom Shower/Tub: Walk-in shower ?  ?Bathroom Toilet: Standard ?  ?  ?Home Equipment: Grab bars - tub/shower;Rolling Walker (2 wheels);Shower seat;BSC/3in1;Crutches ?  ?  ?  ? ?  ?Prior Functioning/Environment Prior Level of Function : Independent/Modified Independent;Driving ?  ?  ?  ?  ?  ?  ?  ?ADLs Comments: minimal IADLs, indep ADLs and driving ?  ? ?  ?  ?OT Problem List:   ?  ?   ?OT Treatment/Interventions:    ?  ?OT Goals(Current goals can be found in the care plan section) Acute Rehab OT Goals ?Patient Stated Goal: home ?OT Goal Formulation: With patient  ?OT Frequency:   ?  ? ?Co-evaluation   ?  ?  ?  ?  ? ?  ?AM-PAC OT "6 Clicks" Daily Activity     ?Outcome Measure Help from another person eating meals?: None ?Help from another person taking care of personal grooming?: None ?Help from another person toileting, which includes using toliet, bedpan,  or urinal?: None ?Help from another person bathing (including washing, rinsing, drying)?: None ?Help from another person to put on and taking off regular upper body clothing?: None ?Help from another person to put on and taking off regular lower body clothing?: None ?6 Click Score: 24 ?  ?End of Session Nurse Communication: Mobility status ? ?Activity Tolerance: Patient tolerated treatment well ?Patient left: in bed;with call bell/phone within reach ? ?OT Visit Diagnosis: Unsteadiness on feet (R26.81)  ?              ?Time: 5009-3818 ?OT Time Calculation (min): 13 min ?Charges:  OT General Charges ?$OT Visit: 1 Visit ?OT Evaluation ?$OT Eval Low Complexity: 1 Low ? ?Jolaine Artist, OT ?Acute Rehabilitation Services ?Pager [phone] ?Office [phone] ? ? ?Delight Stare ?06/27/2021, 11:06 AM ?

## 2021-06-27 NOTE — ED Notes (Signed)
Resting quietly. No signs of distress. No complaints.  ?

## 2021-06-27 NOTE — Discharge Summary (Signed)
?Physician Discharge Summary  ?[name] TZG:[id number] DOB: 1926-06-02 DOA: 06/26/2021 ? ?PCP: Brandy Baton, MD ? ?Admit date: 06/26/2021 ?Discharge date: 06/27/2021 ? ?Admitted From: Home ?Disposition: Home ? ?Recommendations for Outpatient Follow-up:  ?Follow up with PCP in 1-2 weeks ?Follow-up with cardiology regarding arrangement of outpatient heart monitor ?Started on metoprolol tartrate 12.5 mg p.o. twice daily ? ?Home Health: No ?Equipment/Devices: None ? ?Discharge Condition: Stable ?CODE STATUS: DNR ?Diet recommendation: Heart healthy diet ? ?History of present illness: ? ?Brandy Miranda is a 88 year old female with past medical history significant for mild/moderate aortic stenosis, HTN, HLD, CKD stage I who presented to [facility] ED on 4/24 following syncopal episode.  Patient was sitting on the church bench to attend the mass did not start to feel lightheadedness and passed out.  Bystanders reported patient was staring and then became unresponsive and slumped over.  EMS arrived and found the patient in bradycardia, and awake but unresponsive; with gradual recovering consciousness.  Cardiac monitoring strip showed frequent PVCs and doublets.  Patient remembered she did not sleep well last night, and felt lightheadedness this morning.  But no nausea, vomiting,  chest pain,  shortness of breath, no diarrhea.  ? ?In the ED, temperature 97.5 ?F, HR 69, RR 17, BP 149/59, SPO2 97% on room air.  WBC 9.1, hemoglobin 12.7, platelets 248.  Sodium 139, potassium 3.7, chloride 106, CO2 27, glucose 122, BUN 29, creatinine 1.00.  AST 20, ALT 14, total bilirubin 0.6.  High sensitive troponin 6.  Urinalysis negative.  TSH 2.058.  Chest x-ray with no acute cardiopulmonary disease process.  Hospitalist service was consulted for further evaluation management of syncopal episode. ? ? ?Hospital course: ? ? ?Syncopal episode ?Patient presenting to ED via EMS after suffering syncopal episode at church.  This is now a recurrent event.   Patient is afebrile without leukocytosis.  Urinalysis unrevealing.  TSH within normal limits.  High sensitive troponin within normal limits.  EKG with normal sinus rhythm with no concerning dynamic changes.  Orthostatic vital signs negative.  Cardiology was consulted and followed during hospital course.  Patient was monitored on telemetry and remained in normal sinus rhythm with some frequent PVCs.  Patient was started on low-dose metoprolol tartrate 12.5 mg p.o. twice daily.  TTE with LVEF 60 to 65%, no regional wall motion abnormalities, grade 1 diastolic dysfunction, moderate calcification aortic valve with moderate thickening aortic valve and mild/moderate stenosis, IVC normal in size, no MR, moderate/mild TR.  Cardiology recommended ZIO monitor on discharge, will be arranged.  Outpatient follow-up with PCP and cardiology. ? ?Essential hypertension ?Started on metoprolol tartrate 12.5 mg p.o. twice daily as above.  Continue home amlodipine 10 mg p.o. daily, hydrochlorothiazide 25 mg p.o. daily, irbesartan 300 mg p.o. daily. ? ?Mild/moderate aortic stenosis ?Outpatient follow-up with cardiology ? ?CKD stage 1. ?Stable ? ?HLD ?No longer on statin/aspirin outpatient. ? ?Discharge Diagnoses:  ?Principal Problem: ?  Syncope ?Active Problems: ?  Syncope and collapse ?  HTN (hypertension) ?  LBBB (left bundle branch block) ?  Aortic stenosis ? ? ? ?Discharge Instructions ? ?Discharge Instructions   ? ? Call MD for:  difficulty breathing, headache or visual disturbances   Complete by: As directed ?  ? Call MD for:  extreme fatigue   Complete by: As directed ?  ? Call MD for:  persistant dizziness or light-headedness   Complete by: As directed ?  ? Call MD for:  persistant nausea and vomiting   Complete by:  As directed ?  ? Call MD for:  severe uncontrolled pain   Complete by: As directed ?  ? Call MD for:  temperature >100.4   Complete by: As directed ?  ? Diet - low sodium heart healthy   Complete by: As directed ?  ?  Increase activity slowly   Complete by: As directed ?  ? ?  ? ?Allergies as of 06/27/2021   ?No Known Allergies ?  ? ?  ?Medication List  ?  ? ? Notice   ?Cannot display patient medications because the patient has not yet arrived. ?  ? ? Follow-up Information   ? ? Brandy Baton, MD. Schedule an appointment as soon as possible for a visit in 1 week(s).   ?Specialty: Internal Medicine ?Contact information: ?[address] ?[place] [address] ?[phone] ? ? ?  ?  ? ? [facility] Follow up.   ?Contact information: ?[address] ?[place] [address] ?[phone] ? ?  ?  ? ?  ?  ? ?  ? ?No Known Allergies ? ?Consultations: ?Cardiology, Dr. Burt Knack ? ? ?Procedures/Studies: ?DG Chest 2 View ? ?Result Date: 06/26/2021 ?CLINICAL DATA:  Syncope EXAM: CHEST - 2 VIEW COMPARISON:  01/06/2021 FINDINGS: The heart size and mediastinal contours are within normal limits. Both lungs are clear. No pleural effusion. No acute osseous abnormality. IMPRESSION: No acute process in the chest. Electronically Signed   By: Macy Mis M.D.   On: 06/26/2021 09:33  ? ?CT Head Wo Contrast ? ?Result Date: 06/26/2021 ?CLINICAL DATA:  Mental status change, unknown cause EXAM: CT HEAD WITHOUT CONTRAST TECHNIQUE: Contiguous axial images were obtained from the base of the skull through the vertex without intravenous contrast. RADIATION DOSE REDUCTION: This exam was performed according to the departmental dose-optimization program which includes automated exposure control, adjustment of the mA and/or kV according to patient size and/or use of iterative reconstruction technique. COMPARISON:  01/06/2021 FINDINGS: Brain: There is no acute intracranial hemorrhage, mass effect, or edema. Gray-Pompa differentiation is preserved. There is no extra-axial fluid collection. Ventricles and sulci are stable and within normal limits in size and configuration. Patchy hypoattenuation in the supratentorial Debroux matter is  nonspecific but probably reflects stable mild chronic microvascular ischemic changes. Vascular: There is atherosclerotic calcification at the skull base. Skull: Calvarium is unremarkable. Sinuses/Orbits: No acute finding. Other: None. IMPRESSION: No acute intracranial abnormality. Electronically Signed   By: Macy Mis M.D.   On: 06/26/2021 09:36  ? ?ECHOCARDIOGRAM COMPLETE ? ?Result Date: 06/27/2021 ?   ECHOCARDIOGRAM REPORT   Patient Name:   Brindy A Conover Date of Exam: 06/27/2021 Medical Rec #:  [id number]    Height:       61.0 in Accession #:    [id number]   Weight:       126.0 lb Date of Birth:  25-Aug-1926    BSA:          1.552 m? Patient Age:    51 years     BP:           122/81 mmHg Patient Gender: F            HR:           64 bpm. Exam Location:  Inpatient Procedure: 2D Echo, Color Doppler and Cardiac Doppler Indications:     Aortic stenosis  History:         Patient has prior history of Echocardiogram examinations, most  recent 01/08/2021. Aortic Valve Disease; Risk                  Factors:Hypertension.  Sonographer:     Jefferey Pica Referring Phys:  [id number] Lequita Halt Diagnosing Phys: Rudean Haskell MD IMPRESSIONS  1. Left ventricular ejection fraction, by estimation, is 60 to 65%. The left ventricle has normal function. The left ventricle has no regional wall motion abnormalities. There is moderate concentric left ventricular hypertrophy. Left ventricular diastolic parameters are consistent with Grade I diastolic dysfunction (impaired relaxation).  2. Right ventricular systolic function is normal. The right ventricular size is normal. There is normal pulmonary artery systolic pressure.  3. The aortic valve is tricuspid. There is moderate calcification of the aortic valve. There is moderate thickening of the aortic valve. Aortic valve regurgitation is trivial. Aortic valve mean gradient measures 10.0 mmHg. Aortic valve Vmax measures 2.13 m/s. Normal stroke volume index (55  cc/m2) with DVI 0.62. AVA by 2d Planimetry 1.4 cm2. Mild to moderate aortic stenosis.  4. The mitral valve was not well visualized. No evidence of mitral valve regurgitation. No evidence of mitral stenosis.  5. Tric

## 2021-06-27 NOTE — Progress Notes (Signed)
? ?Progress Note ? ?Patient Name: Brandy Miranda ?Date of Encounter: 06/27/2021 ? ?[facility] Cardiologist: None  ? ?Subjective  ? ?Feeling okay.  No chest pain or shortness of breath.  No further dizziness.  Echo is now completed.  Daughter remains at bedside. ? ?Inpatient Medications  ?  ?Scheduled Meds: ? amLODipine  10 mg Oral Daily  ? [START ON 07/02/2021] aspirin EC  81 mg Oral Q Sun  ? atorvastatin  20 mg Oral Daily  ? enoxaparin (LOVENOX) injection  30 mg Subcutaneous Daily  ? hydrochlorothiazide  25 mg Oral Daily  ? irbesartan  300 mg Oral Daily  ? metoprolol tartrate  12.5 mg Oral BID  ? olopatadine  1 drop Both Eyes BID  ? sodium chloride flush  3 mL Intravenous Q12H  ? ?Continuous Infusions: ? ?PRN Meds: ?  ? ?Vital Signs  ?  ?Vitals:  ? 06/27/21 0655 06/27/21 0700 06/27/21 0800 06/27/21 1115  ?BP: (!) 145/105 (!) 144/74 122/81 128/65  ?Pulse: 89 78 79 65  ?Resp: (!) '28 17 18 17  '$ ?Temp:   97.7 ?F (36.5 ?C)   ?TempSrc:   Oral   ?SpO2: 97% 94% 99% 97%  ?Weight:      ?Height:      ? ? ?Intake/Output Summary (Last 24 hours) at 06/27/2021 1258 ?Last data filed at 06/26/2021 1720 ?Gross per 24 hour  ?Intake 39.94 ml  ?Output --  ?Net 39.94 ml  ? ? ?  06/26/2021  ?  8:51 AM 01/08/2021  ?  4:15 AM 01/07/2021  ?  5:57 AM  ?Last 3 Weights  ?Weight (lbs) 126 lb 125 lb 3.2 oz 125 lb 8 oz  ?Weight (kg) 57.153 kg 56.79 kg 56.926 kg  ?   ? ?Telemetry  ?  ?Normal sinus rhythm with frequent PVCs, no bradycardic events, no atrial fibrillation, no sustained arrhythmia - Personally Reviewed ? ? ?Physical Exam  ?Pleasant elderly woman in no distress ?GEN: No acute distress.   ?Neck: No JVD ?Cardiac: RRR with 2/6 harsh crescendo decrescendo murmur at the right upper sternal border ?Respiratory: Clear to auscultation bilaterally. ?GI: Soft, nontender, non-distended  ?MS: No edema; No deformity. ?Neuro:  Nonfocal  ?Psych: Normal affect  ? ?Labs  ?  ?High Sensitivity Troponin:   ?Recent Labs  ?Lab 06/26/21 ?1610 06/26/21 ?1113   ?TROPONINIHS 6 5  ?   ?Chemistry ?Recent Labs  ?Lab 06/26/21 ?9604 06/27/21 ?0354  ?NA 139 138  ?K 3.7 3.4*  ?CL 106 109  ?CO2 27 23  ?GLUCOSE 122* 89  ?BUN 29* 21  ?CREATININE 1.00 0.87  ?CALCIUM 9.4 8.6*  ?MG 1.9  --   ?PROT 6.7  --   ?ALBUMIN 3.5  --   ?AST 20  --   ?ALT 14  --   ?ALKPHOS 44  --   ?BILITOT 0.6  --   ?GFRNONAA 53* >60  ?ANIONGAP 6 6  ?  ?Lipids No results for input(s): CHOL, TRIG, HDL, LABVLDL, LDLCALC, CHOLHDL in the last 168 hours.  ?Hematology ?Recent Labs  ?Lab 06/26/21 ?0911  ?WBC 9.1  ?RBC 4.12  ?HGB 12.7  ?HCT 38.3  ?MCV 93.0  ?MCH 30.8  ?MCHC 33.2  ?RDW 13.2  ?PLT 248  ? ?Thyroid  ?Recent Labs  ?Lab 06/27/21 ?0354  ?TSH 2.058  ?  ?BNPNo results for input(s): BNP, PROBNP in the last 168 hours.  ?DDimer No results for input(s): DDIMER in the last 168 hours.  ? ?Radiology  ?  ?DG  Chest 2 View ? ?Result Date: 06/26/2021 ?CLINICAL DATA:  Syncope EXAM: CHEST - 2 VIEW COMPARISON:  01/06/2021 FINDINGS: The heart size and mediastinal contours are within normal limits. Both lungs are clear. No pleural effusion. No acute osseous abnormality. IMPRESSION: No acute process in the chest. Electronically Signed   By: Macy Mis M.D.   On: 06/26/2021 09:33  ? ?CT Head Wo Contrast ? ?Result Date: 06/26/2021 ?CLINICAL DATA:  Mental status change, unknown cause EXAM: CT HEAD WITHOUT CONTRAST TECHNIQUE: Contiguous axial images were obtained from the base of the skull through the vertex without intravenous contrast. RADIATION DOSE REDUCTION: This exam was performed according to the departmental dose-optimization program which includes automated exposure control, adjustment of the mA and/or kV according to patient size and/or use of iterative reconstruction technique. COMPARISON:  01/06/2021 FINDINGS: Brain: There is no acute intracranial hemorrhage, mass effect, or edema. Gray-Etzkorn differentiation is preserved. There is no extra-axial fluid collection. Ventricles and sulci are stable and within normal limits  in size and configuration. Patchy hypoattenuation in the supratentorial Alcantar matter is nonspecific but probably reflects stable mild chronic microvascular ischemic changes. Vascular: There is atherosclerotic calcification at the skull base. Skull: Calvarium is unremarkable. Sinuses/Orbits: No acute finding. Other: None. IMPRESSION: No acute intracranial abnormality. Electronically Signed   By: Macy Mis M.D.   On: 06/26/2021 09:36  ? ?ECHOCARDIOGRAM COMPLETE ? ?Result Date: 06/27/2021 ?   ECHOCARDIOGRAM REPORT   Patient Name:   Brandy Miranda Date of Exam: 06/27/2021 Medical Rec #:  [id number]    Height:       61.0 in Accession #:    [id number]   Weight:       126.0 lb Date of Birth:  08/01/1926    BSA:          1.552 m? Patient Age:    88 years     BP:           122/81 mmHg Patient Gender: F            HR:           64 bpm. Exam Location:  Inpatient Procedure: 2D Echo, Color Doppler and Cardiac Doppler Indications:     Aortic stenosis  History:         Patient has prior history of Echocardiogram examinations, most                  recent 01/08/2021. Aortic Valve Disease; Risk                  Factors:Hypertension.  Sonographer:     Jefferey Pica Referring Phys:  [id number] Lequita Halt Diagnosing Phys: Rudean Haskell MD IMPRESSIONS  1. Left ventricular ejection fraction, by estimation, is 60 to 65%. The left ventricle has normal function. The left ventricle has no regional wall motion abnormalities. There is moderate concentric left ventricular hypertrophy. Left ventricular diastolic parameters are consistent with Grade I diastolic dysfunction (impaired relaxation).  2. Right ventricular systolic function is normal. The right ventricular size is normal. There is normal pulmonary artery systolic pressure.  3. The aortic valve is tricuspid. There is moderate calcification of the aortic valve. There is moderate thickening of the aortic valve. Aortic valve regurgitation is trivial. Aortic valve mean gradient  measures 10.0 mmHg. Aortic valve Vmax measures 2.13 m/s. Normal stroke volume index (55 cc/m2) with DVI 0.62. AVA by 2d Planimetry 1.4 cm2. Mild to moderate aortic stenosis.  4. The mitral valve was not well  visualized. No evidence of mitral valve regurgitation. No evidence of mitral stenosis.  5. Tricuspid valve regurgitation is mild to moderate.  6. The inferior vena cava is normal in size with greater than 50% respiratory variability, suggesting right atrial pressure of 3 mmHg. Comparison(s): Compared to prior, decreased gradient obtained, suspect similar AV parameters. FINDINGS  Left Ventricle: Left ventricular ejection fraction, by estimation, is 60 to 65%. The left ventricle has normal function. The left ventricle has no regional wall motion abnormalities. The left ventricular internal cavity size was small. There is moderate  concentric left ventricular hypertrophy. Left ventricular diastolic parameters are consistent with Grade I diastolic dysfunction (impaired relaxation). Right Ventricle: The right ventricular size is normal. No increase in right ventricular wall thickness. Right ventricular systolic function is normal. There is normal pulmonary artery systolic pressure. The tricuspid regurgitant velocity is 2.46 m/s, and  with an assumed right atrial pressure of 3 mmHg, the estimated right ventricular systolic pressure is 12.4 mmHg. Left Atrium: Left atrial size was normal in size. Right Atrium: Right atrial size was normal in size. Pericardium: There is no evidence of pericardial effusion. Mitral Valve: The mitral valve was not well visualized. No evidence of mitral valve regurgitation. No evidence of mitral valve stenosis. Tricuspid Valve: The tricuspid valve is normal in structure. Tricuspid valve regurgitation is mild to moderate. Aortic Valve: AVA by 2d Planimetry 1.4 cm2. The aortic valve is tricuspid. There is moderate calcification of the aortic valve. There is moderate thickening of the aortic  valve. Aortic valve regurgitation is trivial. Mild to moderate aortic stenosis is present. Aortic valve mean gradient measures 10.0 mmHg. Aortic valve peak gradient measures 18.1 mmHg. Pulmonic Valve: The pulmon

## 2021-06-27 NOTE — ED Notes (Addendum)
Orthostatics ?Supine 144/74 ?Sitting 145/105 ?Standing 138/95 ?

## 2021-06-27 NOTE — Progress Notes (Unsigned)
Enrolled patient for a 14 day Zio XT monitor to be mailed to patients home   Dr Cooper to read 

## 2021-06-27 NOTE — ED Notes (Signed)
Breakfast order placed ?

## 2021-06-30 DIAGNOSIS — I493 Ventricular premature depolarization: Secondary | ICD-10-CM | POA: Diagnosis not present

## 2021-06-30 DIAGNOSIS — R55 Syncope and collapse: Secondary | ICD-10-CM

## 2021-07-24 ENCOUNTER — Ambulatory Visit: Payer: Medicare Other | Admitting: Physician Assistant

## 2021-07-25 ENCOUNTER — Ambulatory Visit
Admit: 2021-07-25 | Discharge: 2021-07-25 | Payer: MEDICARE | Attending: Hematology & Oncology | Primary: Hematology & Oncology

## 2021-07-25 ENCOUNTER — Ambulatory Visit: Admit: 2021-07-25 | Discharge: 2021-07-25 | Payer: MEDICARE

## 2021-07-25 DIAGNOSIS — C439 Malignant melanoma of skin, unspecified: Principal | ICD-10-CM

## 2021-07-25 DIAGNOSIS — C4359 Malignant melanoma of other part of trunk: Principal | ICD-10-CM

## 2021-07-25 NOTE — Progress Notes (Signed)
Cardiology Office Note    Date:  08/01/2021   ID:  Brandy Miranda, DOB 12/14/1926, MRN [id number]  PCP:  Shon Baton, MD  Cardiologist:  Sherren Mocha, MD  Electrophysiologist:  None   Chief Complaint: f/u syncope  History of Present Illness:   Brandy Miranda is a 88 y.o. female originally from [place] with history of HTN, HLD (no longer on statin per pt preference), hard of hearing, melanoma on back, syncope, PVCs, aortic stenosis/tricuspid regurgitation who is seen for post-hospital f/u. She was recently admitted 06/2021 with syncope while at church. She had not eaten or drank anything prior to going. She began to feel lightheaded/dizzy then had poor recollection of events thereafter. No seizure activity or b/b incontinence reported. Troponins were negative and orthostatic VS were reported to be negative. 2D echo 06/27/21 showed EF 60-65%, moderate LVH, grade 1 DD, mild-moderate AS/TR. She was also found to have frequent PVCs for which low dose metoprolol was started. Outpatient monitor was performed showing predominantly NSR, range 42-179, average 68bpm, QRS morphology changes, 4 SVT runs (max 4 beats), rare PACs, 4.9% PVCs with occasional bigeminy/trigeminy.  She returns for follow-up today with her son. She has not had any cardiac symptoms since discharge from the hospital. She does recall that she had a similar episode of syncope about 3-4 months prior to the recent one. She was at a nail salon, seated, when she suddenly lost consciousness without warning. She underwent neurologic testing in the hospital during that time with negative EEG, unremarkable brain MRI, and normal orthostatics. It was unclear whether this represented syncope versus seizure according to notes. She has not had any recent palpitations. She walks 1-2 miles outdoors a day and remains active.   Labwork independently reviewed: 06/2021 K 3.4, Cr 0.87, TSH wnl, Mg 1.9, CBC wnl, LFTs ok  Cardiology Studies:   Studies reviewed  are outlined and summarized above. Reports included below if pertinent.   2d echo 06/27/21    1. Left ventricular ejection fraction, by estimation, is 60 to 65%. The  left ventricle has normal function. The left ventricle has no regional  wall motion abnormalities. There is moderate concentric left ventricular  hypertrophy. Left ventricular  diastolic parameters are consistent with Grade I diastolic dysfunction  (impaired relaxation).   2. Right ventricular systolic function is normal. The right ventricular  size is normal. There is normal pulmonary artery systolic pressure.   3. The aortic valve is tricuspid. There is moderate calcification of the  aortic valve. There is moderate thickening of the aortic valve. Aortic  valve regurgitation is trivial. Aortic valve mean gradient measures 10.0  mmHg. Aortic valve Vmax measures  2.13 m/s. Normal stroke volume index (55 cc/m2) with DVI 0.62. AVA by 2d  Planimetry 1.4 cm2. Mild to moderate aortic stenosis.   4. The mitral valve was not well visualized. No evidence of mitral valve  regurgitation. No evidence of mitral stenosis.   5. Tricuspid valve regurgitation is mild to moderate.   6. The inferior vena cava is normal in size with greater than 50%  respiratory variability, suggesting right atrial pressure of 3 mmHg.   Comparison(s): Compared to prior, decreased gradient obtained, suspect  similar AV parameters.   Monitor 07/2021 Patient had a min HR of 42 bpm, max HR of 179 bpm, and avg HR of 68 bpm. Predominant underlying rhythm was Sinus Rhythm. QRS morphology changes were present throughout recording. 4 Supraventricular Tachycardia runs occurred, the run with the fastest interval  lasting 4 beats with a max rate of 179 bpm, the longest lasting 4 beats with an avg rate of 97 bpm. Isolated SVEs were rare (<1.0%), SVE Couplets were rare (<1.0%), and SVE Triplets were rare (<1.0%). Isolated VEs were occasional (4.9%, [id number]), VE Couplets were  rare (<1.0%, 2545), and VE Triplets were rare (<1.0%, 190). Ventricular Bigeminy and Trigeminy were present.    Past Medical History:  Diagnosis Date   Cancer ([place]) 2021   Melanoma, back   HOH (hard of hearing)    Wears bilateral hearing aids   Hypercholesteremia    Hypertension     Past Surgical History:  Procedure Laterality Date   MELANOMA EXCISION WITH SENTINEL LYMPH NODE BIOPSY Left 11/04/2019   Procedure: WIDE LOCAL EXCISION  LEFT UPPER BACK MELANOMA WITH ADVANCEMENT FLAP CLOSURE;  Surgeon: [name], MD;  Location: [facility];  Service: General;  Laterality: Left;   SENTINEL NODE BIOPSY Left 11/04/2019   Procedure: SENTINEL NODE BIOPSY;  Surgeon: [name], MD;  Location: [facility];  Service: General;  Laterality: Left;    Current Medications: Current Meds  Medication Sig   amLODipine (NORVASC) 10 MG tablet Take 10 mg by mouth daily.   hydrALAZINE (APRESOLINE) 10 MG tablet Take 10 mg by mouth as needed. If bp is over 160   hydrochlorothiazide (HYDRODIURIL) 25 MG tablet Take 25 mg by mouth daily.   metoprolol tartrate (LOPRESSOR) 25 MG tablet Take 0.5 tablets (12.5 mg total) by mouth 2 (two) times daily.   olmesartan (BENICAR) 40 MG tablet Take 40 mg by mouth daily.   Olopatadine HCl (PATADAY OP) Place 1 drop into both eyes in the morning and at bedtime.   OVER THE COUNTER MEDICATION Take 3 tablets by mouth daily. DoTerra - bone nutrient supplement   OVER THE COUNTER MEDICATION Take 1 capsule by mouth daily. DoTerra - microplex      Allergies:   Patient has no known allergies.   Social History   Socioeconomic History   Marital status: Widowed    Spouse name: Not on file   Number of children: Not on file   Years of education: Not on file   Highest education level: Not on file  Occupational History   Not on file  Tobacco Use   Smoking status: Never   Smokeless tobacco: Never  Vaping Use   Vaping Use: Never used  Substance and Sexual Activity   Alcohol use: Yes     Comment: occ   Drug use: Never   Sexual activity: Not on file  Other Topics Concern   Not on file  Social History Narrative   Not on file   Social Determinants of Health   Financial Resource Strain: Not on file  Food Insecurity: Not on file  Transportation Needs: Not on file  Physical Activity: Not on file  Stress: Not on file  Social Connections: Not on file     Family History:  The patient's family history includes Hypertension in her mother.  ROS:   Please see the history of present illness. Complains of difficulty sleeping sometimes (waking up, falling asleep). We discussed that I do not think this is related to her ectopy. All other systems are reviewed and otherwise negative.    EKG(s)/Additional Labs   EKG:  EKG is not ordered today  Recent Labs: 06/26/2021: ALT 14; Hemoglobin 12.7; Magnesium 1.9; Platelets 248 06/27/2021: BUN 21; Creatinine, Ser 0.87; Potassium 3.4; Sodium 138; TSH 2.058  Recent Lipid Panel No results found for:  CHOL, TRIG, HDL, CHOLHDL, VLDL, LDLCALC, LDLDIRECT  PHYSICAL EXAM:    VS:  BP 130/62   Pulse 81   Ht '5\' 1"'$  (1.549 m)   Wt 127 lb (57.6 kg)   SpO2 96%   BMI 24.00 kg/m   BMI: Body mass index is 24 kg/m.  GEN: Well nourished, well developed female in no acute distress HEENT: normocephalic, atraumatic Neck: no JVD, carotid bruits, or masses Cardiac: RRR; 2/6 SEM without rubs or gallops, no edema  Respiratory:  clear to auscultation bilaterally, normal work of breathing GI: soft, nontender, nondistended, + BS MS: no deformity or atrophy Skin: warm and dry, no rash Neuro:  Alert and Oriented x 3, Strength and sensation are intact, follows commands Psych: euthymic mood, full affect  Wt Readings from Last 3 Encounters:  08/01/21 127 lb (57.6 kg)  06/26/21 126 lb (57.2 kg)  01/08/21 125 lb 3.2 oz (56.8 kg)     ASSESSMENT & PLAN:   1. Syncope - 2 episodes now, once in 01/2021 and then in 06/2021 of unclear etiology. Workup  demonstrated frequent PVCs otherwise unrevealing for acute cause. At this juncture I would like to refer to EP to consider whether loop recorder is reasonable. She is very functional and active at baseline. She and her son are agreeable to discuss. I also relayed the [place] DMV recommendation of no driving for 6 months after episode of passing out.   2. Premature ventricular contractions - started on low dose metoprolol in the hospital, tolerating well. Would continue for now. Will recheck BMET given hypokalemia in the hospital along with Mg.  3. Mild-moderate aortic stenosis/tricuspid regurgitation - per Dr. Burt Knack, not felt to be contributing to her syncope at this time. Will have her f/u with Dr. Burt Knack in 1 year at which time it can be discussed whether repeat echo would be of value.  4. Essential HTN - BP controlled today on present regimen. Her son inquired about scaling back on some of her medications. We cannot really consolidate the HCTZ into any of her meds since her amlodipine and ARB are already at max dose. She brings in a log of BPs, some of which have occasional spikes into the 160-180s. (She uses PRN hydralazine for these events, sparingly, not often.) Therefore I am hesitant to adjust her regimen. Continue to follow clinically.    Disposition: F/u with EP to consider ILR, and 1 year with Dr. Burt Knack.   Medication Adjustments/Labs and Tests Ordered: Current medicines are reviewed at length with the patient today.  Concerns regarding medicines are outlined above. Medication changes, Labs and Tests ordered today are summarized above and listed in the Patient Instructions accessible in Encounters.   Signed, Charlie Pitter, PA-C  08/01/2021 8:33 AM    [place] Phone: [phone]; Fax: [phone]

## 2021-08-01 ENCOUNTER — Encounter: Payer: Self-pay | Admitting: Physician Assistant

## 2021-08-01 ENCOUNTER — Ambulatory Visit (INDEPENDENT_AMBULATORY_CARE_PROVIDER_SITE_OTHER): Payer: Medicare Other | Admitting: Physician Assistant

## 2021-08-01 VITALS — BP 130/62 | HR 81 | Ht 61.0 in | Wt 127.0 lb

## 2021-08-01 DIAGNOSIS — I350 Nonrheumatic aortic (valve) stenosis: Secondary | ICD-10-CM | POA: Diagnosis not present

## 2021-08-01 DIAGNOSIS — R55 Syncope and collapse: Secondary | ICD-10-CM

## 2021-08-01 DIAGNOSIS — I071 Rheumatic tricuspid insufficiency: Secondary | ICD-10-CM

## 2021-08-01 DIAGNOSIS — I493 Ventricular premature depolarization: Secondary | ICD-10-CM

## 2021-08-01 DIAGNOSIS — I10 Essential (primary) hypertension: Secondary | ICD-10-CM

## 2021-08-01 LAB — BASIC METABOLIC PANEL
BUN/Creatinine Ratio: 28 (ref 12–28)
BUN: 30 mg/dL (ref 10–36)
CO2: 26 mmol/L (ref 20–29)
Calcium: 9.8 mg/dL (ref 8.7–10.3)
Chloride: 104 mmol/L (ref 96–106)
Creatinine, Ser: 1.08 mg/dL — ABNORMAL HIGH (ref 0.57–1.00)
Glucose: 88 mg/dL (ref 70–99)
Potassium: 4.2 mmol/L (ref 3.5–5.2)
Sodium: 144 mmol/L (ref 134–144)
eGFR: 48 mL/min/{1.73_m2} — ABNORMAL LOW (ref >59)

## 2021-08-01 LAB — MAGNESIUM: Magnesium: 2.1 mg/dL (ref 1.6–2.3)

## 2021-08-01 NOTE — Patient Instructions (Signed)
Medication Instructions:  Your physician recommends that you continue on your current medications as directed. Please refer to the Current Medication list given to you today. *If you need a refill on your cardiac medications before your next appointment, please call your pharmacy*   Lab Work: TODAY-BMET & MAG If you have labs (blood work) drawn today and your tests are completely normal, you will receive your results only by: [place] (if you have MyChart) OR A paper copy in the mail If you have any lab test that is abnormal or we need to change your treatment, we will call you to review the results.   Testing/Procedures: NONE ORDERED   Follow-Up: At [facility], you and your health needs are our priority.  As part of our continuing mission to provide you with exceptional heart care, we have created designated Provider Care Teams.  These Care Teams include your primary Cardiologist (physician) and Advanced Practice Providers (APPs -  Physician Assistants and Nurse Practitioners) who all work together to provide you with the care you need, when you need it.  We recommend signing up for the patient portal called "MyChart".  Sign up information is provided on this After Visit Summary.  MyChart is used to connect with patients for Virtual Visits (Telemedicine).  Patients are able to view lab/test results, encounter notes, upcoming appointments, etc.  Non-urgent messages can be sent to your provider as well.   To learn more about what you can do with MyChart, go to [email or web address].    Your next appointment:   1 year(s)  The format for your next appointment:   In Person  Provider:   Sherren Mocha, MD    THE PATIENT IS ALSO GOING TO BE REFERRED TO EP LOOP RECORDER CONSIDERATION  Other Instructions   Important Information About Sugar

## 2021-08-01 NOTE — Telephone Encounter (Signed)
Addressed msg in [place], no further action needed

## 2021-08-09 ENCOUNTER — Encounter: Payer: Self-pay | Admitting: Cardiology

## 2021-08-14 ENCOUNTER — Telehealth: Payer: Self-pay | Admitting: Cardiology

## 2021-08-14 NOTE — Telephone Encounter (Signed)
Responded via mychart

## 2021-08-14 NOTE — Telephone Encounter (Signed)
Pt's daughter is wanting to know will pt have monitor implanted at tomorrows appt. Daughter states that pt is expecting for this to happened. Daughter also has a question regarding insurance. Please advise

## 2021-08-15 ENCOUNTER — Encounter: Payer: Self-pay | Admitting: Cardiology

## 2021-08-15 ENCOUNTER — Ambulatory Visit (INDEPENDENT_AMBULATORY_CARE_PROVIDER_SITE_OTHER): Payer: Medicare Other | Admitting: Cardiology

## 2021-08-15 VITALS — BP 126/70 | HR 70 | Ht 61.0 in | Wt 125.0 lb

## 2021-08-15 DIAGNOSIS — R55 Syncope and collapse: Secondary | ICD-10-CM | POA: Diagnosis not present

## 2021-08-15 NOTE — Progress Notes (Signed)
Electrophysiology Office Note   Date:  08/15/2021   ID:  Brandy Miranda, DOB 28-Aug-1926, MRN [id number]  PCP:  Shon Baton, MD  Cardiologist:  Burt Knack Primary Electrophysiologist:  Tamsyn Owusu Brandy Leeds, MD    Chief Complaint: syncope   History of Present Illness: Brandy Miranda is a 88 y.o. female who is being seen today for the evaluation of syncope at the request of Dunn, Dayna N, PA-C. Presenting today for electrophysiology evaluation.  She has a history significant for hypertension, hyperlipidemia, PVCs, syncope.  She was admitted to the hospital April 2023 with syncope while at church.  She had not eaten or drank anything prior to the episode.  She felt lightheaded and dizzy and then had poor recollection of events.  Troponins were negative, orthostatics were negative.  Echo showed a normal ejection fraction.  She wore a cardiac monitor that showed no bradycardia and a 4.9% PVC burden.  She had a similar episode of syncope 3 to 4 months prior.  She was at a nail salon, seated when she suddenly lost consciousness without warning.  She underwent neurologic testing which was normal at that time.  Today, she denies symptoms of palpitations, chest pain, shortness of breath, orthopnea, PND, lower extremity edema, claudication, dizziness, presyncope, syncope, bleeding, or neurologic sequela. The patient is tolerating medications without difficulties.    Past Medical History:  Diagnosis Date   Cancer ([place]) 2021   Melanoma, back   HOH (hard of hearing)    Wears bilateral hearing aids   Hypercholesteremia    Hypertension    Past Surgical History:  Procedure Laterality Date   MELANOMA EXCISION WITH SENTINEL LYMPH NODE BIOPSY Left 11/04/2019   Procedure: WIDE LOCAL EXCISION  LEFT UPPER BACK MELANOMA WITH ADVANCEMENT FLAP CLOSURE;  Surgeon: [name], MD;  Location: [facility];  Service: General;  Laterality: Left;   SENTINEL NODE BIOPSY Left 11/04/2019   Procedure: SENTINEL NODE BIOPSY;  Surgeon:  [name], MD;  Location: [place] [facility];  Service: General;  Laterality: Left;     Current Outpatient Medications  Medication Sig Dispense Refill   amLODipine (NORVASC) 10 MG tablet Take 10 mg by mouth daily.     hydrALAZINE (APRESOLINE) 10 MG tablet Take 10 mg by mouth as needed. If bp is over 160     hydrochlorothiazide (HYDRODIURIL) 25 MG tablet Take 25 mg by mouth daily.     metoprolol tartrate (LOPRESSOR) 25 MG tablet Take 0.5 tablets (12.5 mg total) by mouth 2 (two) times daily. 30 tablet 2   olmesartan (BENICAR) 40 MG tablet Take 40 mg by mouth daily.     Olopatadine HCl (PATADAY OP) Place 1 drop into both eyes in the morning and at bedtime.     OVER THE COUNTER MEDICATION Take 3 tablets by mouth daily. DoTerra - bone nutrient supplement     OVER THE COUNTER MEDICATION Take 1 capsule by mouth daily. DoTerra - microplex     No current facility-administered medications for this visit.    Allergies:   Patient has no known allergies.   Social History:  The patient  reports that she has never smoked. She has never used smokeless tobacco. She reports current alcohol use. She reports that she does not use drugs.   Family History:  The patient's family history includes Hypertension in her mother.    ROS:  Please see the history of present illness.   Otherwise, review of systems is positive for none.   All other systems are  reviewed and negative.    PHYSICAL EXAM: VS:  BP 126/70 (BP Location: Left Arm, Patient Position: Sitting, Cuff Size: Normal)   Pulse 70   Ht '5\' 1"'$  (1.549 m)   Wt 125 lb (56.7 kg)   SpO2 95%   BMI 23.62 kg/m  , BMI Body mass index is 23.62 kg/m. GEN: Well nourished, well developed, in no acute distress  HEENT: normal  Neck: no JVD, carotid bruits, or masses Cardiac: RRR; no murmurs, rubs, or gallops,no edema  Respiratory:  clear to auscultation bilaterally, normal work of breathing GI: soft, nontender, nondistended, + BS MS: no deformity or atrophy  Skin:  warm and dry Neuro:  Strength and sensation are intact Psych: euthymic mood, full affect  EKG:  EKG is not ordered today. Personal review of the ekg ordered 07/03/21 shows sinus rhythm, PVCs, intermittent bundle branch block  Recent Labs: 06/26/2021: ALT 14; Hemoglobin 12.7; Platelets 248 06/27/2021: TSH 2.058 08/01/2021: BUN 30; Creatinine, Ser 1.08; Magnesium 2.1; Potassium 4.2; Sodium 144    Lipid Panel  No results found for: "CHOL", "TRIG", "HDL", "CHOLHDL", "VLDL", "LDLCALC", "LDLDIRECT"   Wt Readings from Last 3 Encounters:  08/15/21 125 lb (56.7 kg)  08/01/21 127 lb (57.6 kg)  06/26/21 126 lb (57.2 kg)      Other studies Reviewed: Additional studies/ records that were reviewed today include: TTE 06/27/21  Review of the above records today demonstrates:   1. Left ventricular ejection fraction, by estimation, is 60 to 65%. The  left ventricle has normal function. The left ventricle has no regional  wall motion abnormalities. There is moderate concentric left ventricular  hypertrophy. Left ventricular  diastolic parameters are consistent with Grade I diastolic dysfunction  (impaired relaxation).   2. Right ventricular systolic function is normal. The right ventricular  size is normal. There is normal pulmonary artery systolic pressure.   3. The aortic valve is tricuspid. There is moderate calcification of the  aortic valve. There is moderate thickening of the aortic valve. Aortic  valve regurgitation is trivial. Aortic valve mean gradient measures 10.0  mmHg. Aortic valve Vmax measures  2.13 m/s. Normal stroke volume index (55 cc/m2) with DVI 0.62. AVA by 2d  Planimetry 1.4 cm2. Mild to moderate aortic stenosis.   4. The mitral valve was not well visualized. No evidence of mitral valve  regurgitation. No evidence of mitral stenosis.   5. Tricuspid valve regurgitation is mild to moderate.   6. The inferior vena cava is normal in size with greater than 50%  respiratory  variability, suggesting right atrial pressure of 3 mmHg.   Cardiac monitor 08/06/2021 personally reviewed The basic rhythm is normal sinus with an average HR of 68 bpm No atrial fibrillation or flutter No high-grade heart block or pathologic pauses There are occasional PVC's with a burden of 4.9% and rare supraventricular beats without sustained arrhythmias   ASSESSMENT AND PLAN:  1.  Syncope: She has had 2 episodes, November 2022 in April 2023.  Each of these were unclear etiology.  She has had a full work-up and worn a monitor that showed no evidence of bradycardia.  That being said she did not have syncope while wearing the monitor.  She would benefit from a Linq monitor implant.  Risk and benefits were discussed risk of bleeding, infection.  She understands these risks and is agreed to the procedure.  2.  PVCs: Low burden of 4.9%.  Continue with current management.  3.  Moderate aortic stenosis/tricuspid regurgitation: Continue with  plan per primary cardiology.  4.  Hypertension: well controlled   Current medicines are reviewed at length with the patient today.   The patient does not have concerns regarding her medicines.  The following changes were made today:  none  Labs/ tests ordered today include:  No orders of the defined types were placed in this encounter.    Disposition:   FU with Besan [employer] ending Linq monitor results  Signed, Thurston Brendlinger Brandy Leeds, MD  08/15/2021 9:18 AM     [facility] [address] [place] [address] [phone] (office) [phone] (fax)  SURGEON:  Allegra Lai, MD     PREPROCEDURE DIAGNOSIS:  Syncope    POSTPROCEDURE DIAGNOSIS:  Syncope     PROCEDURES:   1. Implantable loop recorder implantation    INTRODUCTION:  Brandy Miranda is a 88 y.o. female with a history of syncope who presents today for implantable loop implantation.  The patient has had syncope without a cause identified.   she has worn telemetry  previously during which she did not have arrhythmias.  There is significant concern for possible arrhythmia as the cause for the syncope. The patient therefore presents today for implantable loop implantation.     DESCRIPTION OF PROCEDURE:  Informed written consent was obtained, and the patient was brought to the electrophysiology lab in a fasting state.  The patient required no sedation for the procedure today.  Mapping over the patient's chest was performed by the EP lab staff to identify the area where electrograms were most prominent for ILR recording.  This area was found to be the left parasternal region over the 3rd-4th intercostal space. The patients left chest was therefore prepped and draped in the usual sterile fashion by the EP lab staff. The skin overlying the left parasternal region was infiltrated with lidocaine for local analgesia.  A 0.5-cm incision was made over the left parasternal region over the 3rd intercostal space.  A subcutaneous ILR pocket was fashioned using a combination of sharp and blunt dissection.  A Medtronic Reveal Linq model [place] [id number] implantable loop recorder was then placed into the pocket  R waves were very prominent and measured 0.49m.  Steri- Strips and a sterile dressing were then applied.  There were no early apparent complications.     CONCLUSIONS:   1. Successful implantation of a Medtronic Reveal LINQ implantable loop recorder for syncope  2. No early apparent complications.   Aslan Himes MMeredith Leeds MD 08/15/2021 9:18 AM

## 2021-08-15 NOTE — Patient Instructions (Addendum)
Medication Instructions:  Your physician recommends that you continue on your current medications as directed. Please refer to the Current Medication list given to you today.  Labwork: None ordered.  Testing/Procedures: None ordered.  Follow-Up:  Your physician wants you to follow-up in: as needed with Dr. Curt Bears. We will call you if we find something on the loop recorder monitoring.  Please let us know if you pass out again.  You will receive a reminder letter in the mail two months in advance. If you don't receive a letter, please call our office to schedule the follow-up appointment.    Implantable Loop Recorder Placement, Care After This sheet gives you information about how to care for yourself after your procedure. Your health care provider may also give you more specific instructions. If you have problems or questions, contact your health care provider. What can I expect after the procedure? After the procedure, it is common to have: Soreness or discomfort near the incision. Some swelling or bruising near the incision.  Follow these instructions at home: Incision care  Monitor your cardiac device site for redness, swelling, and drainage. Call the device clinic at [phone] if you experience these symptoms or fever/chills.  Keep the large square bandage on your site for 24 hours and then you may remove it yourself. Keep the steri-strips underneath in place.   You may shower after 72 hours / 3 days from your procedure with the steri-strips in place. They will usually fall off on their own, or may be removed after 10 days. Pat dry.   Avoid lotions, ointments, or perfumes over your incision until it is well-healed.  Please do not submerge in water until your site is completely healed.   Your device is MRI compatible.   Remote monitoring is used to monitor your cardiac device from home. This monitoring is scheduled every month by our office. It allows [place] to keep an eye on  the function of your device to ensure it is working properly.  If your wound site starts to bleed apply pressure.      If you have any questions/concerns please call the device clinic at [phone].  Activity  Return to your normal activities.  General instructions Follow instructions from your health care provider about how to manage your implantable loop recorder and transmit the information. Learn how to activate a recording if this is necessary for your type of device. You may go through a metal detection gate, and you may let someone hold a metal detector over your chest. Show your ID card if needed. Do not have an MRI unless you check with your health care provider first. Take over-the-counter and prescription medicines only as told by your health care provider. Keep all follow-up visits as told by your health care provider. This is important. Contact a health care provider if: You have redness, swelling, or pain around your incision. You have a fever. You have pain that is not relieved by your pain medicine. You have triggered your device because of fainting (syncope) or because of a heartbeat that feels like it is racing, slow, fluttering, or skipping (palpitations). Get help right away if you have: Chest pain. Difficulty breathing. Summary After the procedure, it is common to have soreness or discomfort near the incision. Change your dressing as told by your health care provider. Follow instructions from your health care provider about how to manage your implantable loop recorder and transmit the information. Keep all follow-up visits as told by your  health care provider. This is important. This information is not intended to replace advice given to you by your health care provider. Make sure you discuss any questions you have with your health care provider. Document Released: 01/31/2015 Document Revised: 04/06/2017 Document Reviewed: 04/06/2017 Elsevier Patient Education   2020 [employer].

## 2021-08-22 ENCOUNTER — Institutional Professional Consult (permissible substitution): Payer: Medicare Other | Admitting: Cardiology

## 2021-09-18 ENCOUNTER — Ambulatory Visit (INDEPENDENT_AMBULATORY_CARE_PROVIDER_SITE_OTHER): Payer: Medicare Other

## 2021-09-18 DIAGNOSIS — R55 Syncope and collapse: Secondary | ICD-10-CM | POA: Diagnosis not present

## 2021-09-20 LAB — CUP PACEART REMOTE DEVICE CHECK
Date Time Interrogation Session: 20230716155006
Implantable Pulse Generator Implant Date: 20230613

## 2021-10-02 ENCOUNTER — Other Ambulatory Visit (HOSPITAL_COMMUNITY): Payer: Self-pay | Admitting: Medical

## 2021-10-02 DIAGNOSIS — M79604 Pain in right leg: Secondary | ICD-10-CM

## 2021-10-03 ENCOUNTER — Ambulatory Visit (HOSPITAL_COMMUNITY)
Admission: RE | Admit: 2021-10-03 | Discharge: 2021-10-03 | Disposition: A | Discharge status: Home / Self Care | Payer: Medicare Other | Source: Ambulatory Visit | Attending: Internal Medicine | Admitting: Internal Medicine

## 2021-10-03 DIAGNOSIS — M7989 Other specified soft tissue disorders: Secondary | ICD-10-CM

## 2021-10-03 DIAGNOSIS — R2241 Localized swelling, mass and lump, right lower limb: Secondary | ICD-10-CM | POA: Diagnosis not present

## 2021-10-03 DIAGNOSIS — M79604 Pain in right leg: Secondary | ICD-10-CM

## 2021-10-03 NOTE — Progress Notes (Signed)
Right LE venous duplex study completed. Please see CV Proc for preliminary results.  Royelle Hinchman BS, RVT 10/03/2021 1:01 PM

## 2021-10-10 IMAGING — CR DG CHEST 2V
2 series · 2 of 2 positions shown · non-contrast
Comparison: None.

CLINICAL DATA: Melanoma, preop

EXAM:
CHEST - 2 VIEW

[w chest pa]
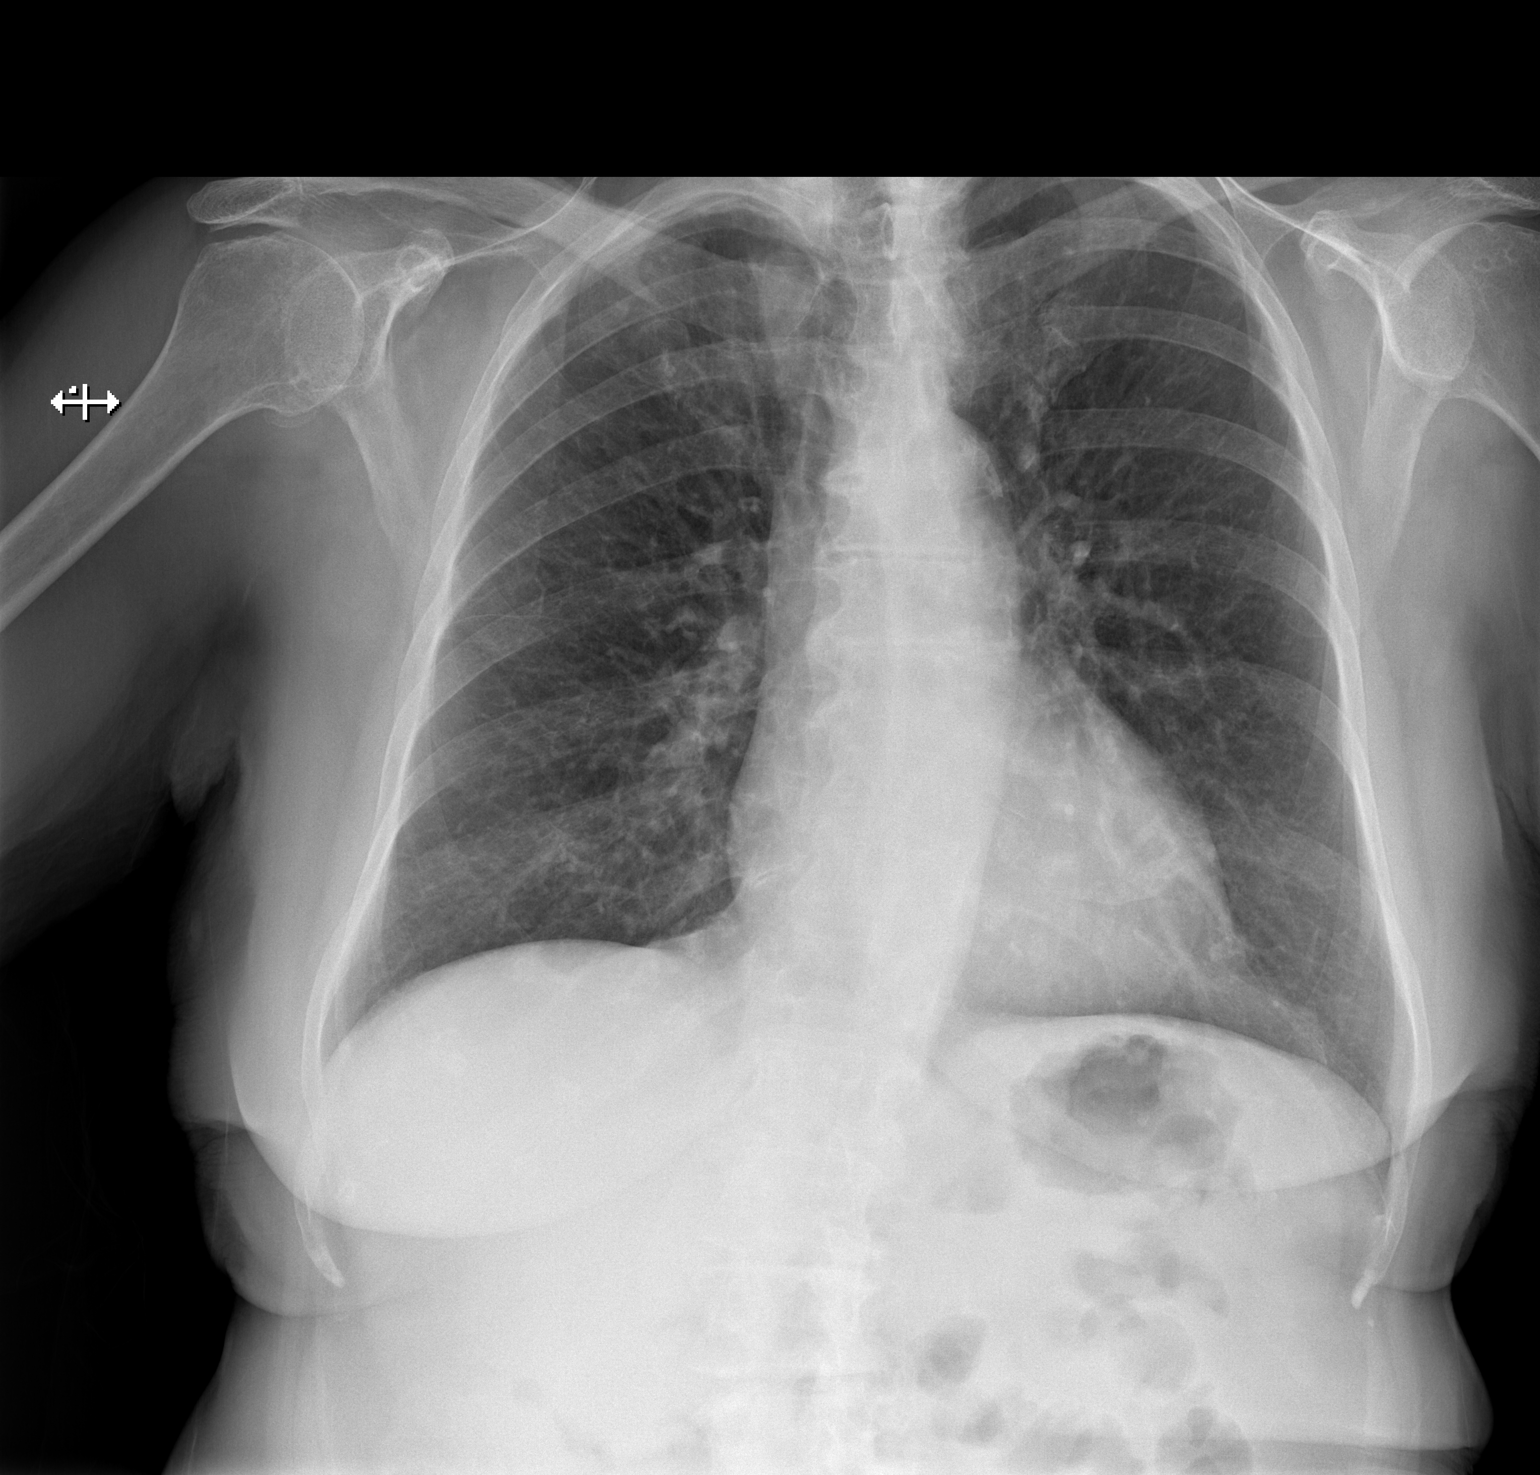

[w chest lat]
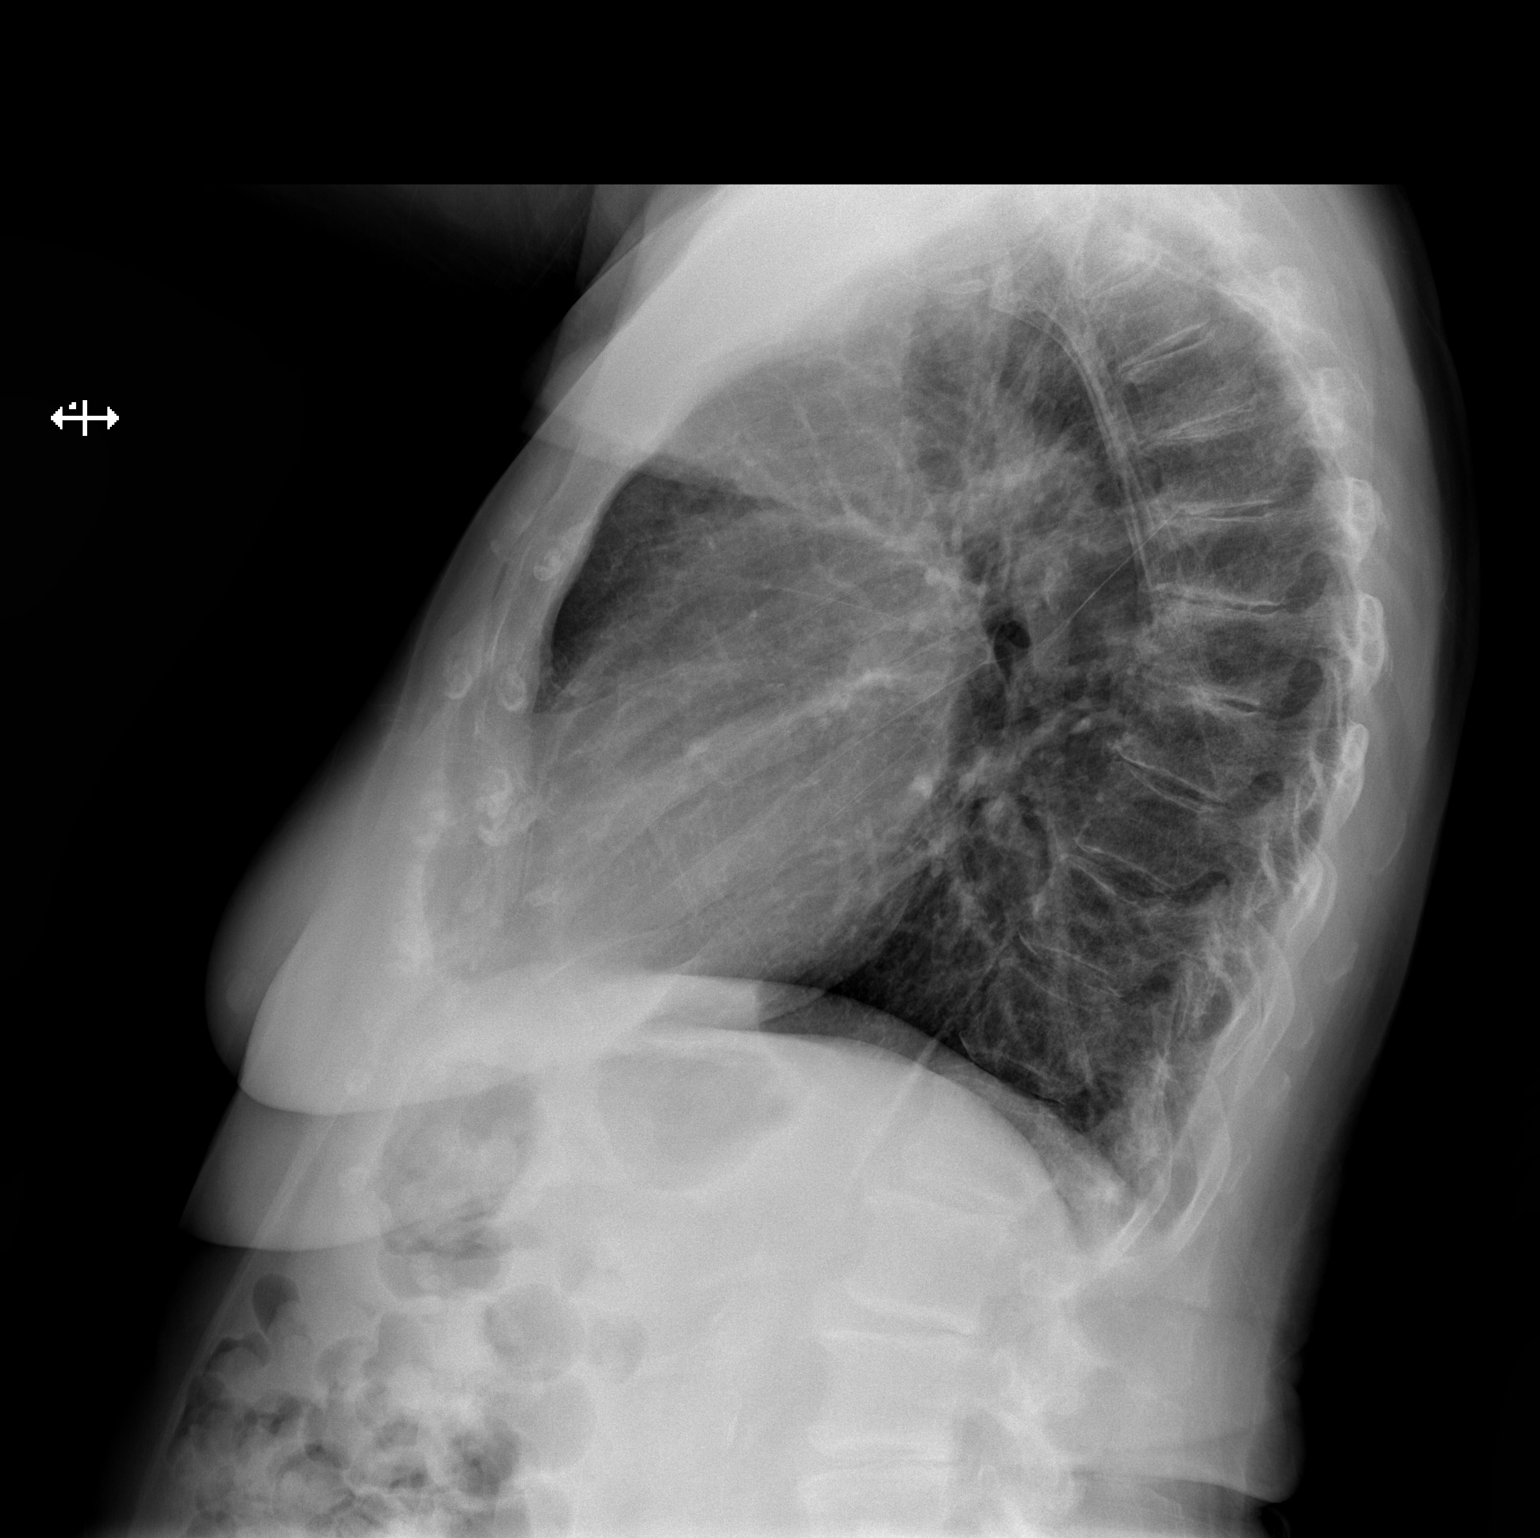

[2 of 2 positions shown; findings below may reference images not displayed]

FINDINGS: Heart and mediastinal contours are within normal limits. No focal
opacities or effusions. No acute bony abnormality.
IMPRESSION: No active cardiopulmonary disease.

## 2021-10-16 NOTE — Progress Notes (Signed)
[facility] Summary Report / Loop Recorder 

## 2021-10-23 ENCOUNTER — Ambulatory Visit (INDEPENDENT_AMBULATORY_CARE_PROVIDER_SITE_OTHER): Payer: Medicare Other

## 2021-10-23 DIAGNOSIS — R55 Syncope and collapse: Secondary | ICD-10-CM

## 2021-10-25 LAB — CUP PACEART REMOTE DEVICE CHECK
Date Time Interrogation Session: 20230818154920
Implantable Pulse Generator Implant Date: 20230613

## 2021-11-20 NOTE — Progress Notes (Signed)
[facility] Summary Report / Loop Recorder 

## 2021-11-27 ENCOUNTER — Ambulatory Visit (INDEPENDENT_AMBULATORY_CARE_PROVIDER_SITE_OTHER): Payer: Medicare Other

## 2021-11-27 DIAGNOSIS — R55 Syncope and collapse: Secondary | ICD-10-CM | POA: Diagnosis not present

## 2021-11-28 ENCOUNTER — Ambulatory Visit: Admit: 2021-11-28 | Discharge: 2021-11-28 | Payer: MEDICARE

## 2021-11-28 ENCOUNTER — Ambulatory Visit
Admit: 2021-11-28 | Discharge: 2021-11-28 | Payer: MEDICARE | Attending: Hematology & Oncology | Primary: Hematology & Oncology

## 2021-11-28 DIAGNOSIS — C439 Malignant melanoma of skin, unspecified: Principal | ICD-10-CM

## 2021-11-28 DIAGNOSIS — C4359 Malignant melanoma of other part of trunk: Principal | ICD-10-CM

## 2021-11-28 LAB — CUP PACEART REMOTE DEVICE CHECK
Date Time Interrogation Session: 20230920155106
Implantable Pulse Generator Implant Date: 20230613

## 2021-11-29 ENCOUNTER — Encounter: Payer: Self-pay | Admitting: Cardiology

## 2021-11-29 ENCOUNTER — Encounter: Payer: Self-pay | Admitting: Cardiovascular Disease

## 2021-12-01 DIAGNOSIS — C4359 Malignant melanoma of other part of trunk: Principal | ICD-10-CM

## 2021-12-06 ENCOUNTER — Ambulatory Visit: Admit: 2021-12-06 | Discharge: 2021-12-07 | Payer: MEDICARE

## 2021-12-15 NOTE — Progress Notes (Signed)
[facility] Summary Report / Loop Recorder 

## 2022-01-01 ENCOUNTER — Ambulatory Visit: Payer: Medicare Other | Attending: Cardiology

## 2022-01-01 DIAGNOSIS — R55 Syncope and collapse: Secondary | ICD-10-CM

## 2022-01-01 LAB — CUP PACEART REMOTE DEVICE CHECK
Date Time Interrogation Session: 20231023164733
Implantable Pulse Generator Implant Date: 20230613

## 2022-02-03 NOTE — Progress Notes (Signed)
[facility] Summary Report / Loop Recorder 

## 2022-02-05 ENCOUNTER — Ambulatory Visit (INDEPENDENT_AMBULATORY_CARE_PROVIDER_SITE_OTHER): Payer: Medicare Other

## 2022-02-05 DIAGNOSIS — R55 Syncope and collapse: Secondary | ICD-10-CM

## 2022-02-06 ENCOUNTER — Ambulatory Visit
Admit: 2022-02-06 | Discharge: 2022-02-07 | Payer: MEDICARE | Attending: Hematology & Oncology | Primary: Hematology & Oncology

## 2022-02-06 DIAGNOSIS — C439 Malignant melanoma of skin, unspecified: Principal | ICD-10-CM

## 2022-02-06 LAB — CUP PACEART REMOTE DEVICE CHECK
Date Time Interrogation Session: 20231203230807
Implantable Pulse Generator Implant Date: 20230613

## 2022-02-13 DIAGNOSIS — C4359 Malignant melanoma of other part of trunk: Principal | ICD-10-CM

## 2022-03-07 DIAGNOSIS — Z5181 Encounter for therapeutic drug level monitoring: Principal | ICD-10-CM

## 2022-03-07 DIAGNOSIS — C4359 Malignant melanoma of other part of trunk: Principal | ICD-10-CM

## 2022-03-07 DIAGNOSIS — Z79899 Other long term (current) drug therapy: Principal | ICD-10-CM

## 2022-03-07 MED ORDER — ENCORAFENIB 75 MG CAPSULE
ORAL_CAPSULE | Freq: Every day | ORAL | 11 refills | 30 days | Status: CP
Start: 2022-03-07 — End: ?
  Filled 2022-04-10: qty 90, 30d supply, fill #0

## 2022-03-07 MED ORDER — MEKTOVI 15 MG TABLET
ORAL_TABLET | Freq: Two times a day (BID) | ORAL | 11 refills | 30 days | Status: CP
Start: 2022-03-07 — End: ?
  Filled 2022-04-10: qty 60, 30d supply, fill #0

## 2022-03-11 DIAGNOSIS — C4359 Malignant melanoma of other part of trunk: Principal | ICD-10-CM

## 2022-03-12 ENCOUNTER — Ambulatory Visit (INDEPENDENT_AMBULATORY_CARE_PROVIDER_SITE_OTHER): Payer: Medicare Other

## 2022-03-12 DIAGNOSIS — R55 Syncope and collapse: Secondary | ICD-10-CM

## 2022-03-13 LAB — CUP PACEART REMOTE DEVICE CHECK
Date Time Interrogation Session: 20240107230821
Implantable Pulse Generator Implant Date: 20230613

## 2022-03-14 ENCOUNTER — Encounter: Payer: Self-pay | Admitting: Cardiology

## 2022-03-16 NOTE — Progress Notes (Signed)
[facility] Summary Report / Loop Recorder

## 2022-03-30 ENCOUNTER — Ambulatory Visit
Admit: 2022-03-30 | Discharge: 2022-03-30 | Payer: MEDICARE | Attending: Hematology & Oncology | Primary: Hematology & Oncology

## 2022-03-30 ENCOUNTER — Ambulatory Visit: Admit: 2022-03-30 | Discharge: 2022-03-30 | Payer: MEDICARE

## 2022-04-09 NOTE — Unmapped (Signed)
Patient's daughter requested to run Braftovi 1st: copay is 1106.89 and Johney Maine is 1264.41 for a total of 2371.30.     [facility]   Patient Onboarding/Medication Counseling    Ms.Domond is a 89 y.o. female with melanoma who I am counseling today on initiation of therapy.  I am speaking to the patient's family member, daughter .    Was a Nurse, learning disability used for this call? No    Verified patient's date of birth / HIPAA.    Specialty medication(s) to be sent: Hematology/Oncology: Merlene Morse      Non-specialty medications/supplies to be sent: n/a      Medications not needed at this time: n/a         Braftovi (Encorafenib)    Medication & Administration     Dosage: Melanoma (unresectable or metastatic): Take three 75mg  capsules (225 mg) by mouth once daily. (in combination with binimetinib)     Administration:     Administer orally at the same time each day.   May be administered with or without food.    Adherence/Missed dose instructions:   Take a missed dose as soon as you think about.  If it is less than 12 hours until the next dose, skip the missed dose and go back to your normal time. Do not take 2 doses at the same time or extra doses.    Lab Requirement: Confirm BRAF V600 mutation status prior to treatment initiation: BRAF V600 mutation status confirmed and documented in chart    Goals of Therapy     To prevent disease progression    Side Effects & Monitoring Parameters     Commonly reported side effects  Fatigue  Nausea, vomiting  Diarrhea  Constipation  Abdominal pain  Rash  Itching  Change in taste  Painful extremities  Muscle pain, joint pain, back pain  Hard, thick, dry skin  Dizziness   Headache  Hair loss  Hand foot syndrome  Nail and skin changes (including acneiform rash)    The following side effects should be reported to the provider:  Signs of infection (fever >100.4, chills, mouth sores, sputum production)  Signs of hand foot syndrome (redness or irritation of palms or soles of feet)  Severe or persistent loss of energy and strength  Signs of bleeding (vomiting or coughing up blood, blood that looks like coffee grounds, blood in the urine or black, red tarry stools, bruising that gets bigger without reason, any persistent or severe bleeding, impaired wound healing)  Signs of cerebrovascular disease (change in strength on one side is greater than the other, trouble speaking or thinking, change in balance, vision changes)  Signs of fluid and electrolyte problems (mood changes, confusion, muscle pain or weakness, abnormal/fast heartbeat, severe dizziness, passing out)  Signs of high blood sugar (confusion, fatigue, increased thirst, increase hunger, passing a lot of urine, flushing, fast breathing, breathe that smells like fruit)  Vison changes (eye pain, severe eye irritation, seeing colored dots, seeing halos or bright lights around lights)  Mole changes  Skin changes  Impaired skin wound healing  Burning or numbness feeling  Fast heartbeat, abnormal heartbeat or passing out  Signs of anaphylaxis (wheezing, chest tightness, swelling of face, lips, tongue or throat)  Very bad headache    Monitoring Parameters:   BRAFV600K or V600E mutation status (prior to treatment)  Electrolytes  Verify pregnancy status in females of reproductive potential prior to treatment initiation  Dermatologic evaluations prior to initiation, every 2  months during therapy, and for up to 6 months following discontinuation to assess for new cutaneous malignancies  Monitor for signs/symptoms of uveitis, hemorrhage, and dermatologic toxicity, and for non-cutaneous malignancies.   LFTs and Renal Function tests.  Ophthalmological tests  Monitor adherence.    Contraindications, Warnings, & Precautions     Dermatologic toxicity: Grade 3 or 4 dermatologic toxicity occurred in ~20% of patients receiving encorafenib as a single agent, compared to 2% of patients receiving the combination of encorafenib and binimetinib.  GI toxicity: Encorafenib is associated with a moderate emetic potential; antiemetics may be recommended to prevent nausea and vomiting.  Hemorrhage: Hemorrhage, including ? grade 3 events, may occur when administered in combination with other agents.   Malignancy: New primary malignancies (cutaneous and non-cutaneous) have been observed in patients treated with BRAF inhibitors and may occur with encorafenib.   Ocular toxicity: Uveitis has been observed with the combination of encorafenib and binimetinib.   QT prolongation: Encorafenib is associated with dose dependent QTc interval prolongation.   Appropriate use: Encorafenib is not indicated for treatment of patients with wild-type BRAF melanoma or wild-type BRAF colorectal cancer. Exposing BRAF wild-type cells to BRAF inhibitors such as encorafenib may result in paradoxical activation of MAP-kinase signaling and increased cell proliferation. Prior to initiating therapy, confirm BRAF V600E or BRAF V600K mutation status with an approved test.   Reproductive Considerations: Verify pregnancy status in females of reproductive potential prior to initiating encorafenib therapy. Females of reproductive potential should use a highly effective non-hormonal contraceptive during therapy and for at least 2 weeks after the last encorafenib dose; hormonal contraceptives may not be effective. Based on its mechanism of action and on findings in animal reproduction studies, encorafenib may cause fetal harm if administered during pregnancy.  Breastfeeding Considerations:  It is not known if encorafenib is present in breast milk.  Due to the potential for serious adverse reactions in the breastfed infant, breastfeeding is not recommended by the manufacturer during treatment and for 2 weeks after the last encorafenib dose.    Drug/Food Interactions     Medication list reviewed in Epic. The patient was instructed to inform the care team before taking any new medications or supplements. No drug interactions identified.   Avoid grapefruit and grapefruit juice (may increase encorafenib concentrations).    Storage, Handling Precautions, & Disposal     Store at room temperature in the original container (do not use a pillbox or store with other medications). Do not take out of the anti-moisture cube or packet.   Store in a dry place.  Do not store in a bathroom.    Caregivers helping administer medication should wear gloves and wash hands immediately after.    Keep the lid tightly closed. Keep out of the reach of children and pets.  Do not flush down a toilet or pour down a drain unless instructed to do so.  Check with your local police department or fire station about drug take-back programs in your area.      Mektovi (Binimetinib)    Medication & Administration     Dosage: Melanoma: 15 mg by mouth twice daily (in combination with encorafenib)    Administration:   Take about 12 hours apart  Take with or without food at the same scheduled time(s) each day for each dose.      Adherence/Missed dose instructions:  Do not administer a missed dose if <6 hours until the next dose.   Do not take an additional dose if  vomiting occurs; resume with the next scheduled dose.  If taken in combination with paniumumab infusion:  Apply moisturizing cream (e.g. Aquaphor) twice daily to the face, hands, feet, neck, back and chest.   Apply sunscreen with SPF of at least 30 prior to sun exposure.   Take Doxycycline 100mg  by mouth every 12 hours starting 1 day before starting infusion    Lab Requirement (IF USED WITH BRAFTOVI): Confirm BRAF V600 mutation status prior to treatment initiation: BRAF V600 mutation status confirmed and documented in chart    Goals of Therapy     To prevent disease progression    Side Effects & Monitoring Parameters     Commonly reported side effects  Nausea  Vomiting  Diarrhea  Abdominal Pain  Constipation  Changes in vision  Peripheral edema  Rash  Fatigue    The following side effects should be reported to the provider:  Signs of infection (fever >100.4, chills, mouth sores, sputum production)  Severe or persistent loss of energy and strength  Severe headache, dizziness, passing out  Signs of blood clots (numbness or weakness on one side of the body, pain, redness, tenderness, warmth, or swelling in the arms or legs, change in color of an arm of leg, chest pain, shortness of breath, fast heartbeat)  Signs of bleeding (vomiting or coughing up blood, blood that looks like coffee grounds, blood in the urine or black, red tarry stools, bruising that gets bigger without reason, any persistent or severe bleeding, impaired wound healing)  Signs of a severe pulmonary disorder (trouble breathing, shortness of breath, a cough that is new or worse).    Signs of liver problems (dark urine, abdominal pain, light-colored stools, vomiting, yellow skin or eyes)  Signs of low sodium (head, trouble focusing, trouble with memory, confusion, weakness, seizures, change in balance)  Vison changes (eye pain, swelling, redness, blurred vision, blindness severe eye irritation, seeing colored dots, seeing halos or bright lights around lights)  Signs of heart problems (cough or shortness of breath that is new or worse, swelling of ankle of legs, abnormal heartbeat, weight gain of 5 of more lbs. in 24 hours, dizziness or passing out)  Signs of anaphylaxis (wheezing, chest tightness, swelling of face, lips, tongue or throat)    Monitoring Parameters: BRAFV600K or V600E mutation status (prior to treatment); monitor liver function tests prior to treatment initiation, monthly during treatment, and as clinically indicated; monitor CPK and creatinine levels prior to treatment initiation, periodically during treatment, and as clinically indicated. Verify pregnancy status in females of reproductive potential prior to treatment initiation. Assess ejection fraction, prior to treatment initiation, one month after initiation, and then every 2 to 3 months during treatment; Evaluate visual symptoms at each visit; perform an ophthalmologic examination at regular intervals and for new or worsening visual disturbances; perform ophthalmologic evaluation for patient-reported acute vision loss or other visual disturbance within 24 hours. Monitor for signs/symptoms of dermatologic toxicity, hemorrhage, interstitial lung disease, ocular toxicity, rhabdomyolysis, and thromboembolism. Monitor adherence.    Contraindications, Warnings, & Precautions     Females of reproductive potential should use a highly effective contraceptive during therapy and for at least 30 days after the final dose.  Breastfeeding is not recommended during treatment and for 3 days after the last dose.  Cardiotoxicity: Cardiomyopathy has been reported in patients who received the combination of binimetinib and encorafenib.   GI toxicity: Binimetinib is associated with a moderate emetic potential.  Hemorrhage: Hemorrhage may occur with the combination of binimetinib  and encorafenib, including ? grade 3 hemorrhage. The most frequent hemorrhagic events were gastrointestinal in nature. Fatal intracranial hemorrhage was also reported.   Hepatotoxicity: Hepatotoxicity may occur with the combination of binimetinib and encorafenib; grade 3 or 4 elevations in ALT, AST, and/or alkaline phosphatase have been reported.  Ocular toxicity: Serous retinopathy has occurred with the combination of binimetinib and encorafenib. Symptomatic serous retinopathy has been reported, although blindness did not occur. Uveitis has been reported in patients treated with the combination of binimetinib and encorafenib  Pulmonary toxicity: Cases of interstitial lung disease (ILD), including pneumonitis, developed in patients with BRAF mutation-positive melanoma receiving the combination of binimetinib and encorafenib.   Rhabdomyolysis: Rhabdomyolysis may occur with the combination of binimetinib and encorafenib; CPK elevations may commonly occur.   Thromboembolism: VTE, including PE, has occurred in patients receiving binimetinib in combination with encorafenib.   Appropriate use: Prior to initiating therapy, confirm BRAF V600E or BRAF V600K mutation status with an approved test.   Reproductive considerations: Verify pregnancy status in females of reproductive potential prior to initiating binimetinib therapy. Females of reproductive potential should use a highly effective contraceptive during therapy and for at least 30 days after the final binimetinib dose. Based on its mechanism of action and on findings in animal reproduction studies, binimetinib may cause fetal harm if administered during pregnancy.  Breastfeeding considerations: It is not known if binimetinib is present in breast milk.  Due to the potential for serious adverse reactions in the breastfed infant, breastfeeding is not recommended by the manufacturer during treatment and for 3 days after the last binimetinib dose.    Drug/Food Interactions     Medication list reviewed in Epic. The patient was instructed to inform the care team before taking any new medications or supplements. No drug interactions identified.     Storage, Handling Precautions, & Disposal     Store at room temperature in the original container (do not use a pillbox or store with other medications).   Store in a dry place.  Do not store in a bathroom.    Caregivers helping administer medication should wear gloves and wash hands immediately after.  Store at room temperature in a dry location.  Keep away from children and pets.  Throw away unused or expired drugs. There may be drug take-back programs in your area.Keep the lid tightly closed. Keep out of the reach of children and pets.  Do not flush down a toilet or pour down a drain unless instructed to do so.  Check with your local police department or fire station about drug take-back programs in your area.        Current Medications (including OTC/herbals), Comorbidities and Allergies     Current Outpatient Medications   Medication Sig Dispense Refill    amLODIPine (NORVASC) 10 MG tablet Take 1 tablet (10 mg total) by mouth.      amlodipine-benazepril (LOTREL) 10-20 mg per capsule Take 1 capsule by mouth daily.      atorvastatin (LIPITOR) 20 MG tablet       binimetinib (MEKTOVI) 15 mg tablet Take 1 tablet (15 mg total) by mouth two (2) times a day. 60 tablet 11    cefdinir (OMNICEF) 300 MG capsule TAKE 1 CAPSULE BY MOUTH TWICE A DAY AS DIRECTED FOR 10 DAYS      encorafenib (BRAFTOVI) 75 mg capsule Take 3 capsules (225 mg total) by mouth daily. 90 capsule 11    hydrALAZINE (APRESOLINE) 10 MG tablet 1 TABLET DAILY AS NEEDED  FOR SYSTOLIC BLOOD PRESSURE > 160 ORALLY ONE TIME A DAY 90 DAYS      hydroCHLOROthiazide (HYDRODIURIL) 25 MG tablet       hydroCHLOROthiazide (HYDRODIURIL) 25 MG tablet Take 1 tablet (25 mg total) by mouth daily.      metoprolol tartrate (LOPRESSOR) 25 MG tablet TAKE 0.5 TABLETS BY MOUTH 2 TIMES DAILY.      multivitamin,ther and minerals (MULTI-VITAMIN HP/MINERALS ORAL) Take 1 tablet by mouth daily.      olmesartan (BENICAR) 40 MG tablet       olmesartan (BENICAR) 40 MG tablet Take 1 tablet (40 mg total) by mouth daily.      olopatadine (PATANOL) 0.1 % ophthalmic solution Apply to eye.      sertraline (ZOLOFT) 50 MG tablet TAKE 1 TABLET BY MOUTH DAILY AT BEDTIME ORALLY AS DIRECTED 30 DAYS       No current facility-administered medications for this visit.       No Known Allergies    Patient Active Problem List   Diagnosis    Malignant melanoma of lower back (CMS-HCC)    High risk medication use    Excessive cerumen in both ear canals    HLD (hyperlipidemia)    HTN (hypertension)    Hypokalemia    Hypomagnesemia    LBBB (left bundle branch block)    Leukocytosis    Presbycusis of both ears    Syncope and collapse    Aortic stenosis    Arthralgia of right ankle    Swelling of lower leg    Syncope       Reviewed and up to date in Epic.    Appropriateness of Therapy     Acute infections noted within Epic:  No active infections  Patient reported infection: None    Is medication and dose appropriate based on diagnosis and infection status? Yes    Prescription has been clinically reviewed: Yes      Baseline Quality of Life Assessment      How many days over the past month did your condition  keep you from your normal activities? For example, brushing your teeth or getting up in the morning. 2    Financial Information     Medication Assistance provided: None Required    Anticipated copay of  $1106.89 (Braftovi) and $1264.41 [facility]) for a total of 2371.30 reviewed with patient. Verified delivery address.    Delivery Information     Scheduled delivery date: 2/7    Expected start date: 2/7    Medication will be delivered via UPS to the prescription address in [facility].  This shipment will not require a signature.      Explained the services we provide at [facility] and that each month we would call to set up refills.  Stressed importance of returning phone calls so that we could ensure they receive their medications in time each month.  Informed patient that we should be setting up refills 7-10 days prior to when they will run out of medication.  A pharmacist will reach out to perform a clinical assessment periodically.  Informed patient that a welcome packet, containing information about our pharmacy and other support services, a Notice of Privacy Practices, and a drug information handout will be sent.      The patient or caregiver noted above participated in the development of this care plan and knows that they can request review of or adjustments to the care plan at any time.  Patient or caregiver verbalized understanding of the above information as well as how to contact the pharmacy at [phone] option 4 with any questions/concerns.  The pharmacy is open Monday through Friday 8:30am-4:30pm.  A pharmacist is available 24/7 via pager to answer any clinical questions they may have.    Patient Specific Needs     Does the patient have any physical, cognitive, or cultural barriers? No    Does the patient have adequate living arrangements? (i.e. the ability to store and take their medication appropriately) Yes    Did you identify any home environmental safety or security hazards? No    Patient prefers to have medications discussed with  Family Member     Is the patient or caregiver able to read and understand education materials at a high school level or above? Yes    Patient's primary language is  English     Is the patient high risk? Yes, patient is taking oral chemotherapy. Appropriateness of therapy as been assessed    SOCIAL DETERMINANTS OF HEALTH     At the [facility], we have learned that life circumstances - like trouble affording food, housing, utilities, or transportation can affect the health of many of our patients.   That is why we wanted to ask: are you currently experiencing any life circumstances that are negatively impacting your health and/or quality of life? Patient declined to answer    Social Determinants of Health     Financial Resource Strain: Not on file   Internet Connectivity: Not on file   Food Insecurity: Not on file   Tobacco Use: Low Risk  (04/29/2020)    Patient History     Smoking Tobacco Use: Never     Smokeless Tobacco Use: Never     Passive Exposure: Not on file   Housing/Utilities: Not on file   Alcohol Use: Not on file   Transportation Needs: Not on file   Substance Use: Not on file   Health Literacy: Not on file   Physical Activity: Not on file   Interpersonal Safety: Not on file   Stress: Not on file   Intimate Partner Violence: Not on file   Depression: Not on file   Social Connections: Not on file       Would you be willing to receive help with any of the needs that you have identified today? Not applicable       Clydell Hakim, PharmD  [facility] Specialty Pharmacist

## 2022-04-10 DIAGNOSIS — C4359 Malignant melanoma of other part of trunk: Principal | ICD-10-CM

## 2022-04-12 NOTE — Unmapped (Signed)
Ms. Charbonneau daughter sent me an email. Ms. Forys started her targeted tx the evening of 2/7.

## 2022-04-13 MED ORDER — HYDRALAZINE 10 MG TABLET
ORAL_TABLET | Freq: Three times a day (TID) | ORAL | 0 refills | 120 days | Status: CP
Start: 2022-04-13 — End: 2022-08-11

## 2022-04-16 ENCOUNTER — Ambulatory Visit: Payer: Medicare Other

## 2022-04-16 DIAGNOSIS — R55 Syncope and collapse: Secondary | ICD-10-CM

## 2022-04-17 ENCOUNTER — Ambulatory Visit
Admit: 2022-04-17 | Discharge: 2022-04-18 | Payer: MEDICARE | Attending: Hematology & Oncology | Primary: Hematology & Oncology

## 2022-04-17 LAB — CUP PACEART REMOTE DEVICE CHECK
Date Time Interrogation Session: 20240209230615
Implantable Pulse Generator Implant Date: 20230613

## 2022-04-18 NOTE — Unmapped (Signed)
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 This is an early visit for an elderly anxious 89 yo to diagnose early side effects. Issues to be discussed:  -We are overall pleased by the well tolerance. We emphasized that 80% of most side effects occur during this first week with the exception of fatigue may be further evolve. In particular, I am pleased with the acceptable blood pressure control, which for now is managed with the prn addition of hydralazine. We will continue with that strategy.  -She will continue with the same dose of enco-bini for one more week. We will see her in person next week with blood work to document laboratory changes if any. We plan to stop enco-bini for 2 weeks and then restart to ensure cumulative fatigue is not overwhelming for her. We will perform restaging CT scans in 2 months after starting treatment.    I spent 15 min discussing medications, side effects and oncology plans.    Diagnosis: Melanoma, cutaneous primary  Other: None  Stage: M1b  Current Therapy: day 10 of enco-bini     REFERRING PROVIDER  Taylor Adkins-referral    ONCOLOGY HISTORY  -mid summer 2021; changing (raising, itching) L upper back lesion  -10/12/19; melanoma, superficial spreading type, Breslow thickness 3.99mm, no ulceration, no satellitosis, 11 mitoses/mm2, no regression, non-brisk tumor-infiltrating lymphocytes ([id number]).  -11/04/19;  wide excision and sentinel lymph node mapping c/w residual melanoma, superficial spreading type, Breslow thickness 0.2 mm, 0 mitoses per millimeter square, small incidental compound nevus.  Single left axillary sentinel lymph node was removed and was positive for endometrial metastatic foci of melanoma (sizes of 2 mm, 0.8 mm, and 0.1 mm).  -11/30/19;  significant for a 6 mm left thyroid lobe nodule partially calcified and mildly FDG avid.  Mild FDG avidity in the left axilla of the largest left axillary lymph node measuring 1.4 cm (SUV max of 2.8).  -02/01/20; started adjuvant single-agent nivolumab  -02/19/20; last (second) dose of nivolumab (120mg ); treatment was held due to development of constitutional symptoms.   -07/07/20; punch mid back lesion showed metastatic melanoma ([id number], [facility]).   -08/03/21; palliative resection of the L upper back lump c/w metastatic melanoma  ([id number], [facility]).   -11/28/21; cancer progression to lungs, soft tissues.     SUBJECTIVE  Taylor Adkins is a 89 y.o. female who is phoned in to discuss her side effects after she started low dose enco-bini for metastatic BRAFV600K mutant melanoma. I confirmed that patient was located at her house within the state of [place] at the time of the phone visit. I also obtained her verbal consent to review interim events and medication changes from her medical record. She started 225 mg of enco and 15mg  of bini 5 days ago. Prior to that visit she saw her ophthalmologist who did not diagnose any posterior serous retinopathy. She complains of some AM lightheadedness but not to the point that she wants to fall. She monitors her blood pressure; it appears that only once the BP has been more than 150/80, for which she received hydralazine once.   She admits she is more tired ever since she started the two pills. She denies any vision changes, n, v, diarrhea, rash, ha. She has the same degree of overall anxiety, but she is less anxious about the overall tolerance to the pills. She also denies any joint aches, muscle aches. She is more hopeful about her tolerance to the drugs and overall (good) outcome. Other systems were  reviewed and were negative.     MEDICATIONS  Notvasc 10mg  every day, atorvastatin 20mg  every day, enco 225mg  every day, bini 15mg  bid, hydralazine 10mg  tid prn, hydrochlorothiazide 25mg  every day, lopressore 25mg  bid, multivitamins, benicar 40mg  every day.    OBJECTIVE  This was a phone visit.

## 2022-04-24 NOTE — Progress Notes (Signed)
[facility] Summary Report / Loop Recorder 

## 2022-04-25 NOTE — Unmapped (Signed)
Ms. Daloisio daughter, Olegario Messier, sent me an email to share that mom's systolic BP was 205. She took hydralazine and the number went to 175. When I called Ms. Buttrey, the BP was 152/80, Her other daughter, Harriett Sine , was there.   Olegario Messier had shared that mom felt more woozy lately. I discussed this with Ms. Sahr. She does not feel markedly different since starting the targeted tx. Perhaps a tad more tired. She is able to do her usual ADLs and activities. Her daughter, Harriett Sine, confirmed this.     I commended Ms. Bence on her handling of the high BP. I told her that all considered, she is doing quite well on the new regimen. She will let me know if the light-headedness or fatigue worsens prior to her appt on Friday.

## 2022-04-27 ENCOUNTER — Other Ambulatory Visit: Admit: 2022-04-27 | Discharge: 2022-04-28 | Payer: MEDICARE

## 2022-04-27 ENCOUNTER — Ambulatory Visit
Admit: 2022-04-27 | Discharge: 2022-04-28 | Payer: MEDICARE | Attending: Hematology & Oncology | Primary: Hematology & Oncology

## 2022-04-27 DIAGNOSIS — C4359 Malignant melanoma of other part of trunk: Principal | ICD-10-CM

## 2022-04-27 LAB — COMPREHENSIVE METABOLIC PANEL
ALBUMIN: 3.4 g/dL (ref 3.4–5.0)
ALKALINE PHOSPHATASE: 56 U/L (ref 46–116)
ALT (SGPT): 19 U/L (ref 10–49)
ANION GAP: 6 mmol/L (ref 5–14)
AST (SGOT): 27 U/L (ref <=34)
BILIRUBIN TOTAL: 0.6 mg/dL (ref 0.3–1.2)
BLOOD UREA NITROGEN: 24 mg/dL — ABNORMAL HIGH (ref 9–23)
BUN / CREAT RATIO: 25
CALCIUM: 9.1 mg/dL (ref 8.7–10.4)
CHLORIDE: 108 mmol/L — ABNORMAL HIGH (ref 98–107)
CO2: 28 mmol/L (ref 20.0–31.0)
CREATININE: 0.95 mg/dL
EGFR CKD-EPI (2021) FEMALE: 56 mL/min/{1.73_m2} — ABNORMAL LOW (ref >=60)
GLUCOSE RANDOM: 95 mg/dL (ref 70–179)
POTASSIUM: 4 mmol/L (ref 3.4–4.8)
PROTEIN TOTAL: 6.8 g/dL (ref 5.7–8.2)
SODIUM: 142 mmol/L (ref 135–145)

## 2022-04-27 LAB — CBC W/ AUTO DIFF
BASOPHILS ABSOLUTE COUNT: 0 10*9/L (ref 0.0–0.1)
BASOPHILS RELATIVE PERCENT: 0.5 %
EOSINOPHILS ABSOLUTE COUNT: 0.1 10*9/L (ref 0.0–0.5)
EOSINOPHILS RELATIVE PERCENT: 1.1 %
HEMATOCRIT: 37.6 % (ref 34.0–44.0)
HEMOGLOBIN: 12.5 g/dL (ref 11.3–14.9)
LYMPHOCYTES ABSOLUTE COUNT: 2.3 10*9/L (ref 1.1–3.6)
LYMPHOCYTES RELATIVE PERCENT: 43.4 %
MEAN CORPUSCULAR HEMOGLOBIN CONC: 33.4 g/dL (ref 32.0–36.0)
MEAN CORPUSCULAR HEMOGLOBIN: 30.6 pg (ref 25.9–32.4)
MEAN CORPUSCULAR VOLUME: 91.6 fL (ref 77.6–95.7)
MEAN PLATELET VOLUME: 9 fL (ref 6.8–10.7)
MONOCYTES ABSOLUTE COUNT: 0.6 10*9/L (ref 0.3–0.8)
MONOCYTES RELATIVE PERCENT: 10.5 %
NEUTROPHILS ABSOLUTE COUNT: 2.4 10*9/L (ref 1.8–7.8)
NEUTROPHILS RELATIVE PERCENT: 44.5 %
PLATELET COUNT: 246 10*9/L (ref 150–450)
RED BLOOD CELL COUNT: 4.1 10*12/L (ref 3.95–5.13)
RED CELL DISTRIBUTION WIDTH: 13.7 % (ref 12.2–15.2)
WBC ADJUSTED: 5.4 10*9/L (ref 3.6–11.2)

## 2022-04-27 NOTE — Unmapped (Signed)
Patient was screened high risk for falls, the following steps were taken to reduce the risk of falls:  - Patient using wheelchair, wheels were locked prior to standing/transferring patient  - Physical environment was clear of slip/trip hazards  - Footwear observed to ensure it was appropriate, if not, patient advised on importance of well fitted, closed toe, sturdy shoes with non-skid soles  - Call bell within patient's reach  - Instructed patient and/or caregiver to use call bell for assistance before standing, ambulating or transfers  - Offered patient snacks and/or drink   - ???Preventing falls: Care instructions??? given to patient

## 2022-04-27 NOTE — Unmapped (Addendum)
ASSESSMENT AND PLAN  Metastatic melanoma that is currently asymptomatic, resistant to single-agent nivolumab as of 26 months ago. Her locoregional disease was managed with local surgeries and an overall kick the can down the road approach. She has presented with new distant-metastatic disease over the last 4 months. She was found to have a BRAFV600K mutation. This finding triggered discussions about need to start targeted therapy vs. Not. Issues to be discussed:  -It is apparent that with steady-state enco-bini levels she experiences more side effects. Notable more fatigue, lightheadedness, and more hypertensive episodes. At this time none of these side effects is uncontrolled. For the blood pressure we will continue to have her check her blood pressure tid and even administer hydralazine tid prn. In retrospect, it was a wise idea to administer medium-low doses of enco-bini on a two-week-on two-week off basis. I am glad that she will stop enco-bini for this 4 week cycle.  -Given the fact that use of these potentially toxic drugs is not a standard practice for 10 year olds (who may have unpredictable ways of drug clearance), we will closely follow her over the next 2 weeks. We will aim for a phone visit in 2 weeks, before she starts the new cycle. Again, we should take for granted that she will absolutely start enco-bini in 2 weeks, until we make sure that side effects clear completely.    We will see her after another 2 week enco-bini treatment; in the end of this 2 week period we will perform restaging scans. This 6-week interval is sufficient to assess the antitumor effect enco-bini at the dose and schedule administered.    Diagnosis: Melanoma, cutaneous primary  Other: None  Stage: M1b  Current Therapy: Enco-bini q2 weeks      REFERRING PROVIDER  Self-referral    ONCOLOGY HISTORY   -mid summer 2021; changing (raising, itching) L upper back lesion  -10/12/19; melanoma, superficial spreading type, Breslow thickness 3.45mm, no ulceration, no satellitosis, 11 mitoses/mm2, no regression, non-brisk tumor-infiltrating lymphocytes ([id number]).  -11/04/19;  wide excision and sentinel lymph node mapping c/w residual melanoma, superficial spreading type, Breslow thickness 0.2 mm, 0 mitoses per millimeter square, small incidental compound nevus.  Single left axillary sentinel lymph node was removed and was positive for endometrial metastatic foci of melanoma (sizes of 2 mm, 0.8 mm, and 0.1 mm).  -11/30/19;  significant for a 6 mm left thyroid lobe nodule partially calcified and mildly FDG avid.  Mild FDG avidity in the left axilla of the largest left axillary lymph node measuring 1.4 cm (SUV max of 2.8).  -02/01/20; started adjuvant single-agent nivolumab  -02/19/20; last (second) dose of nivolumab (120mg ); treatment was held due to development of constitutional symptoms.   -07/07/20; punch mid back lesion showed metastatic melanoma ([id number], [facility]).   -08/03/21; palliative resection of the L upper back lump c/w metastatic melanoma  ([id number], [facility]).   -11/28/21; cancer progression to lungs, soft tissues.    SUBJECTIVE  Taylor Adkins is a 89 y.o. female who presents on day 15 on low-dose enco-bini for her BRAFV600K-metastatic melanoma. Since last phone visit 10 days ago, she has been experiencing more consistent high blood pressure, with the highest reading recorded at 205. This has resulted in more frequent use of hydralazine, approximately once- twice a day (in fact, she took hydralazine this AM before she came to the clinic). Her fatigue has been slightly worse since last phone visit; nevertheless, she remains capable of performing her usual ADLs and she  continues to live by herself. She continues to experience lightheadedness in the AM, but now over the last 10 days, it appears to be for the duration of the day. She continues to partake in solitary walks, believing they alleviate her dizziness, and denies the need for accompaniment during these walks.    She continues to be anxious and depressed, mainly about her disease; she wishes that she know if the drugs have any effect to shrink the tumor because it would make her feel so much better. She does not admit that depression is worse.      She denies symptoms such as shortness of breath, chest pain, cough, vision changes, and dry skin. However, she reports a sensation of chest pressure, which she attributes to stress rather than a new symptom. Taylor Adkins has also noted a decrease in appetite, forcing herself to eat despite a lack of hunger, but denies leg swelling. Her bowel movements are regular and solid, without signs of constipation. Sleep quality has not deteriorated further this week compared to previous weeks.  Other systems were reviewed and were negative.       MEDICATIONS    amLODIPine (NORVASC) 10 MG tablet, Take 1 tablet (10 mg total) by mouth.    atorvastatin (LIPITOR) 20 MG tablet,     binimetinib (MEKTOVI) 15 mg tablet, Take 1 tablet (15 mg total) by mouth two (2) times a day.    encorafenib (BRAFTOVI) 75 mg capsule, Take 3 capsules (225 mg total) by mouth daily.    hydrALAZINE (APRESOLINE) 10 MG tablet, Take 1 tablet (10 mg total) by mouth Three (3) times a day.    hydroCHLOROthiazide (HYDRODIURIL) 25 MG tablet,     metoprolol tartrate (LOPRESSOR) 25 MG tablet, TAKE 0.5 TABLETS BY MOUTH 2 TIMES DAILY.    multivitamin,ther and minerals (MULTI-VITAMIN HP/MINERALS ORAL), Take 1 tablet by mouth daily.    olmesartan (BENICAR) 40 MG tablet,     olopatadine (PATANOL) 0.1 % ophthalmic solution,     olmesartan (BENICAR) 40 MG tablet, Take 1 tablet (40 mg total) by mouth daily.    OBJECTIVE  GENERAL:  Taylor Adkins appears alert, oriented, and cooperative.  Weight 57.1kg, Temp 36.4, HR 67, RR 17, BP 182/84, O2SATs 98%, PERFORMANCE STATUS ECOG 0  Skin with no rash. Long-standing keratotic lesions in sun-exposed areas, no suspicious pigmented lesions that require any excision  No sinus tenderness, mouth is benign, no cervical or supraclavicular lymphadenopathy  Lungs are clear to auscultation, heart is regular rate and rhythm, normal S1/S2, no murmur  BACK well-healed scar on the L flank area without satellite or in-transit disease  Abdomen is soft, nontender, no organomegaly, no inguinal adenopathy  Extremities with no edema, no calf tenderness.    LABORATORY DATA  CBC w/diff and CHEM20 were performed today and were within normal limits, except for chloride 108, BUN 24.    I attest that I, Corinda Gubler, personally documented this note while acting as scribe for [employer], MD.      Corinda Gubler, Scribe.  04/27/2022   ______________________________________________      Documentation assistance provided by Medical Scribe, Corinda Gubler, who was present during the entirety of the visit. I reviewed the note below and validated all of the information provided to ensure accuracy and completeness.     Taylor Messmer, MD    ------------------------------------------------------------

## 2022-04-30 NOTE — Unmapped (Signed)
Ms. Disotell daughter, Ms. Chestine Spore, sent me an email sharing that Taylor Adkins's stomach has been upset. She has been a bit shaky and nauseous and weak. She did stop her targeted tx on Friday. Ms. Chestine Spore was also concerned about the recent labs.  I explained that the lab work was only a smidge off and indicated some dehydration. I recommended Ms. Dado see her PCP if she is not feeling better in a day or so. She may indeed have a GI bug.

## 2022-05-02 ENCOUNTER — Emergency Department (HOSPITAL_COMMUNITY): Payer: Medicare Other

## 2022-05-02 ENCOUNTER — Emergency Department (HOSPITAL_COMMUNITY)
Admission: EM | Admit: 2022-05-02 | Discharge: 2022-05-02 | Disposition: A | Discharge status: Home / Self Care | Payer: Medicare Other | Attending: Emergency Medicine | Admitting: Emergency Medicine

## 2022-05-02 ENCOUNTER — Other Ambulatory Visit: Payer: Self-pay

## 2022-05-02 ENCOUNTER — Encounter (HOSPITAL_COMMUNITY): Payer: Self-pay

## 2022-05-02 DIAGNOSIS — R531 Weakness: Secondary | ICD-10-CM | POA: Diagnosis present

## 2022-05-02 DIAGNOSIS — R5383 Other fatigue: Secondary | ICD-10-CM | POA: Diagnosis not present

## 2022-05-02 DIAGNOSIS — Z79899 Other long term (current) drug therapy: Secondary | ICD-10-CM | POA: Diagnosis not present

## 2022-05-02 DIAGNOSIS — I10 Essential (primary) hypertension: Secondary | ICD-10-CM

## 2022-05-02 DIAGNOSIS — R42 Dizziness and giddiness: Secondary | ICD-10-CM | POA: Insufficient documentation

## 2022-05-02 LAB — CBC WITH DIFFERENTIAL/PLATELET
Abs Immature Granulocytes: 0 10*3/uL (ref 0.00–0.07)
Basophils Absolute: 0 10*3/uL (ref 0.0–0.1)
Basophils Relative: 0 %
Eosinophils Absolute: 0.1 10*3/uL (ref 0.0–0.5)
Eosinophils Relative: 2 %
HCT: 35.8 % — ABNORMAL LOW (ref 36.0–46.0)
Hemoglobin: 11.7 g/dL — ABNORMAL LOW (ref 12.0–15.0)
Lymphocytes Relative: 15 %
Lymphs Abs: 0.8 10*3/uL (ref 0.7–4.0)
MCH: 31 pg (ref 26.0–34.0)
MCHC: 32.7 g/dL (ref 30.0–36.0)
MCV: 94.7 fL (ref 80.0–100.0)
Monocytes Absolute: 0.5 10*3/uL (ref 0.1–1.0)
Monocytes Relative: 10 %
Neutro Abs: 3.9 10*3/uL (ref 1.7–7.7)
Neutrophils Relative %: 73 %
Platelets: 226 10*3/uL (ref 150–400)
RBC: 3.78 MIL/uL — ABNORMAL LOW (ref 3.87–5.11)
RDW: 12.9 % (ref 11.5–15.5)
WBC: 5.4 10*3/uL (ref 4.0–10.5)
nRBC: 0 % (ref 0.0–0.2)
nRBC: 0 /100 WBC

## 2022-05-02 LAB — COMPREHENSIVE METABOLIC PANEL
ALT: 19 U/L (ref 0–44)
AST: 22 U/L (ref 15–41)
Albumin: 3.3 g/dL — ABNORMAL LOW (ref 3.5–5.0)
Alkaline Phosphatase: 43 U/L (ref 38–126)
Anion gap: 12 (ref 5–15)
BUN: 24 mg/dL — ABNORMAL HIGH (ref 8–23)
CO2: 26 mmol/L (ref 22–32)
Calcium: 9.6 mg/dL (ref 8.9–10.3)
Chloride: 102 mmol/L (ref 98–111)
Creatinine, Ser: 0.99 mg/dL (ref 0.44–1.00)
GFR, Estimated: 53 mL/min — ABNORMAL LOW (ref >60)
Glucose, Bld: 191 mg/dL — ABNORMAL HIGH (ref 70–99)
Potassium: 3.9 mmol/L (ref 3.5–5.1)
Sodium: 140 mmol/L (ref 135–145)
Total Bilirubin: 0.3 mg/dL (ref 0.3–1.2)
Total Protein: 6.8 g/dL (ref 6.5–8.1)

## 2022-05-02 LAB — URINALYSIS, ROUTINE W REFLEX MICROSCOPIC
Bilirubin Urine: NEGATIVE
Glucose, UA: NEGATIVE mg/dL
Hgb urine dipstick: NEGATIVE
Ketones, ur: NEGATIVE mg/dL
Leukocytes,Ua: NEGATIVE
Nitrite: NEGATIVE
Protein, ur: NEGATIVE mg/dL
Specific Gravity, Urine: <1.005 — ABNORMAL LOW (ref 1.005–1.030)
pH: 6 (ref 5.0–8.0)

## 2022-05-02 NOTE — ED Triage Notes (Addendum)
Pt arrives via EMS from home. Pt reports her blood pressure has been elevated today. She states she feels weak and fatigued. She denies any other associated symptoms. Pt is AxOx4. Pt states she also undergoing a Cancer treatment experiment for her Melanoma.

## 2022-05-02 NOTE — ED Provider Notes (Signed)
[facility] Provider Note   CSN: [id number] Arrival date & time: 05/02/22  1401     History  Chief Complaint  Patient presents with   Weakness   Fatigue    Brandy Miranda is a 89 y.o. female.  89 year old female presents with several weeks of weakness with some lightheadedness.  No associated severe headaches.  No chest pain or shortness of breath.  No urinary symptoms.  No fever or chills.  She does endorse more stress recently.  Denies any changes to her medications.  No URI symptoms.  Denies any urinary symptoms.  Blood pressure has been elevated more recently.  She takes multiple medications for this.  Denies any syncope or near syncope       Home Medications Prior to Admission medications   Medication Sig Start Date End Date Taking? Authorizing Provider  amLODipine (NORVASC) 10 MG tablet Take 10 mg by mouth daily. 06/30/19   [provider]  hydrALAZINE (APRESOLINE) 10 MG tablet Take 10 mg by mouth as needed. If bp is over 160    [provider]  hydrochlorothiazide (HYDRODIURIL) 25 MG tablet Take 25 mg by mouth daily. 01/20/18   [provider]  metoprolol tartrate (LOPRESSOR) 25 MG tablet Take 0.5 tablets (12.5 mg total) by mouth 2 (two) times daily. 06/27/21 09/25/21  [place] Eric J, DO  olmesartan (BENICAR) 40 MG tablet Take 40 mg by mouth daily. 01/13/18   [provider]  Olopatadine HCl (PATADAY OP) Place 1 drop into both eyes in the morning and at bedtime.    [provider]  OVER THE COUNTER MEDICATION Take 3 tablets by mouth daily. [employer] - bone nutrient supplement    [provider]  OVER THE COUNTER MEDICATION Take 1 capsule by mouth daily. [employer] - microplex    [provider]      Allergies    Patient has no known allergies.    Review of Systems   Review of Systems  All other systems reviewed and are negative.   Physical Exam Updated Vital  Signs BP (!) 156/60   Pulse 85   Temp 97.7 F (36.5 C)   Resp 18   Ht 1.549 m ('5\' 1"'$ )   Wt 56.7 kg   SpO2 100%   BMI 23.62 kg/m  Physical Exam Vitals and nursing note reviewed.  Constitutional:      General: She is not in acute distress.    Appearance: Normal appearance. She is well-developed. She is not toxic-appearing.  HENT:     Head: Normocephalic and atraumatic.  Eyes:     General: Lids are normal.     Conjunctiva/sclera: Conjunctivae normal.     Pupils: Pupils are equal, round, and reactive to light.  Neck:     Thyroid: No thyroid mass.     Trachea: No tracheal deviation.  Cardiovascular:     Rate and Rhythm: Normal rate and regular rhythm.     Heart sounds: Normal heart sounds. No murmur heard.    No gallop.  Pulmonary:     Effort: Pulmonary effort is normal. No respiratory distress.     Breath sounds: Normal breath sounds. No stridor. No decreased breath sounds, wheezing, rhonchi or rales.  Abdominal:     General: There is no distension.     Palpations: Abdomen is soft.     Tenderness: There is no abdominal tenderness. There is no rebound.  Musculoskeletal:  General: No tenderness. Normal range of motion.     Cervical back: Normal range of motion and neck supple.  Skin:    General: Skin is warm and dry.     Findings: No abrasion or rash.  Neurological:     Mental Status: She is alert and oriented to person, place, and time. Mental status is at baseline.     GCS: GCS eye subscore is 4. GCS verbal subscore is 5. GCS motor subscore is 6.     Cranial Nerves: No cranial nerve deficit.     Sensory: No sensory deficit.     Motor: Motor function is intact.  Psychiatric:        Attention and Perception: Attention normal.        Speech: Speech normal.        Behavior: Behavior normal.     ED Results / Procedures / Treatments   Labs (all labs ordered are listed, but only abnormal results are displayed) Labs Reviewed  COMPREHENSIVE METABOLIC PANEL -  Abnormal; Notable for the following components:      Result Value   Glucose, Bld 191 (*)    BUN 24 (*)    Albumin 3.3 (*)    GFR, Estimated 53 (*)    All other components within normal limits  CBC WITH DIFFERENTIAL/PLATELET - Abnormal; Notable for the following components:   RBC 3.78 (*)    Hemoglobin 11.7 (*)    HCT 35.8 (*)    All other components within normal limits  URINALYSIS, ROUTINE W REFLEX MICROSCOPIC - Abnormal; Notable for the following components:   Specific Gravity, Urine <1.005 (*)    All other components within normal limits    EKG EKG Interpretation  Date/Time:  Wednesday May 02 2022 14:11:43 EST Ventricular Rate:  69 PR Interval:  172 QRS Duration: 142 QT Interval:  428 QTC Calculation: 458 R Axis:   38 Text Interpretation: Normal sinus rhythm Left bundle branch block Abnormal ECG When compared with ECG of 26-Jun-2021 08:44, PREVIOUS ECG IS PRESENT No significant change since last tracing Confirmed by Lacretia Leigh ([id number]) on 05/02/2022 4:49:02 PM  Radiology DG Chest Portable 1 View  Result Date: 05/02/2022 CLINICAL DATA:  Weakness.  Elevated blood pressure today. EXAM: PORTABLE CHEST 1 VIEW COMPARISON:  Radiographs 06/26/2021 and 01/06/2021. FINDINGS: 1415 hours. The heart size and mediastinal contours are stable with mild aortic atherosclerosis. The lungs appear clear. There is no pleural effusion or pneumothorax. No acute osseous findings are evident. There are mild degenerative changes in the spine associated with a thoracolumbar scoliosis. Probable loop recorder overlying the left lower chest. IMPRESSION: No evidence of active cardiopulmonary process. Aortic atherosclerosis. Electronically Signed   By: Richardean Sale M.D.   On: 05/02/2022 14:40    Procedures Procedures    Medications Ordered in ED Medications - No data to display  ED Course/ Medical Decision Making/ A&P                             Medical Decision Making  Patient is EKG per  interpretation shows normal sinus rhythm.  Patient's x-ray shows no acute cardiopulmonary process.  Patient's urinalysis negative here.  Labs here are reassuring.  And daughter do endorse patient has been somewhat more anxious recently.  Suspect that is driving her blood pressure slightly.  Blood pressure elevated here but does not represent hypertensive crisis or emergency.  Discussed with patient's primary care doctor, Dr. Virgina Jock, he  will see the patient tomorrow or the next day for blood pressure management.  Patient's daughter at bedside and is agreeable to this.        Final Clinical Impression(s) / ED Diagnoses Final diagnoses:  None    Rx / DC Orders ED Discharge Orders     None         Lacretia Leigh, MD 05/02/22 [phone]

## 2022-05-02 NOTE — ED Provider Triage Note (Signed)
Emergency Medicine Provider Triage Evaluation Note  Brandy Miranda , a 89 y.o. female  was evaluated in triage.  Pt complains of mucus, fatigue.  She reports that this is been going on for a few weeks.  Patient is a cancer patient with diagnosed melanoma.  She reports that she is currently undergoing experimental cancer treatment with a 1 week on 1 week off regimen.  Patient is unsure which medication is currently taking.  Patient [place] any chest pain, shortness of breath, fevers, cough, congestion, vomiting, diarrhea.  Review of Systems  Positive: As above Negative: As above  Physical Exam  BP (!) 156/60   Pulse 85   Temp 97.7 F (36.5 C)   Resp 18   Ht '5\' 1"'$  (1.549 m)   Wt 56.7 kg   SpO2 100%   BMI 23.62 kg/m  Gen:   Awake, no distress   Resp:  Normal effort  MSK:   Moves extremities without difficulty  Other:   Medical Decision Making  Medically screening exam initiated at 2:10 PM.  Appropriate orders placed.  DARLIN DRESSEL was informed that the remainder of the evaluation will be completed by another provider, this initial triage assessment does not replace that evaluation, and the importance of remaining in the ED until their evaluation is complete.     Luvenia Heller, PA-C 05/02/22 [phone]

## 2022-05-04 ENCOUNTER — Ambulatory Visit
Admit: 2022-05-04 | Discharge: 2022-05-05 | Payer: MEDICARE | Attending: Hematology & Oncology | Primary: Hematology & Oncology

## 2022-05-04 NOTE — Unmapped (Addendum)
ASSESSMENT AND PLAN  Metastatic melanoma that is currently asymptomatic, resistant to single-agent nivolumab as of 26 months ago. Her locoregional disease was managed with local surgeries and an overall kick the can down the road approach. She has presented with new distant-metastatic disease over the last 4 months. She was found to have a BRAFV600K mutation. This has been progressing on successive scans. She has started on enco-bini on the basis of acceptable side effect profile in case series from elderly individuals (>26 yo; [facility] Melanoma Res 2021). Issues to be discussed:  -She clearly has not recovered from the 2 weeks of enco-bini. I wonder if, similar to other drugs with delayed clearance, she also experiences a similar issue with enco-bini. In retrospect, we are happy with a more than 50% dose reduction and only 50% exposure (over a lunar month). It is possible that future cycles may shorten duration of exposure further (and/or reduce enco to 150 mg every day). I have reassured her that I do not plan to restart enco-bini unless she absolutely comes back to baseline.  -We anticipate that side effects related with enco-bini, such as llighteadedness and malignant hypertension may stop next week.    We will extend another phone visit next week, before she is scheduled to start the second round of enco-bini. We will perform restaging CT scans in the end of the 2 week treatment.    I spent 15 min discussing interim events, reviewed hospital records, and discuss future oncology plans.    Diagnosis: Melanoma, cutaneous primary  Other: None  Stage: M1b  Current Therapy: Enco-bini q2 weeks      REFERRING PROVIDER  Self-referral    ONCOLOGY HISTORY   -mid summer 2021; changing (raising, itching) L upper back lesion  -10/12/19; melanoma, superficial spreading type, Breslow thickness 3.53mm, no ulceration, no satellitosis, 11 mitoses/mm2, no regression, non-brisk tumor-infiltrating lymphocytes ([id number]).  -11/04/19;  wide excision and sentinel lymph node mapping c/w residual melanoma, superficial spreading type, Breslow thickness 0.2 mm, 0 mitoses per millimeter square, small incidental compound nevus.  Single left axillary sentinel lymph node was removed and was positive for endometrial metastatic foci of melanoma (sizes of 2 mm, 0.8 mm, and 0.1 mm).  -11/30/19;  significant for a 6 mm left thyroid lobe nodule partially calcified and mildly FDG avid.  Mild FDG avidity in the left axilla of the largest left axillary lymph node measuring 1.4 cm (SUV max of 2.8).  -02/01/20; started adjuvant single-agent nivolumab  -02/19/20; last (second) dose of nivolumab (120mg ); treatment was held due to development of constitutional symptoms.   -07/07/20; punch mid back lesion showed metastatic melanoma ([id number], [facility]).   -08/03/21; palliative resection of the L upper back lump c/w metastatic melanoma  ([id number], [facility]).   -11/28/21; cancer progression to lungs, soft tissues.  -04/12/22; started enco-bini (225/15)    SUBJECTIVE  Taylor Adkins is a 89 y.o. female who presents on day 22 on low-dose enco-bini for her BRAFV600K-metastatic melanoma. I confirmed that patient was located at her house within the state of [place] at the time of the phone visit. I also obtained her verbal consent to review interim events and medication changes from her medical record. She has now stopped enco-bini a week ago. She feels a lot better with less frequent lightheadedness, less fatigue, and better appetite. However, she continues to have hypertensive episodes requiring prn doses of hydralazine. In fact, she ended up to the ED at [facility] in 05/02/22 with weakness and lightheadedness. She  denied syncope, chest or SOB. BP was elevated to 156/60. Her 12-lead ECG showed LBBB but no significant changes. Chest-X-ray showed only chronic changes (thoracolumbar scoliosis, aortic atherosclerosis). Blood work was also reassuring. She was overall reassured.     She denies ha, cp, abdominal pain, new lumps, n, v, diarrhea. She remains anxious and depressed.  Persistent low-grade lightheadedness.    MEDICATIONS    amLODIPine (NORVASC) 10 MG tablet, Take 1 tablet (10 mg total) by mouth.    atorvastatin (LIPITOR) 20 MG tablet,     binimetinib (MEKTOVI) 15 mg tablet, Take 1 tablet (15 mg total) by mouth two (2) times a day.    encorafenib (BRAFTOVI) 75 mg capsule, Take 3 capsules (225 mg total) by mouth daily.    hydrALAZINE (APRESOLINE) 10 MG tablet, Take 1 tablet (10 mg total) by mouth Three (3) times a day.    hydroCHLOROthiazide (HYDRODIURIL) 25 MG tablet,     metoprolol tartrate (LOPRESSOR) 25 MG tablet, TAKE 0.5 TABLETS BY MOUTH 2 TIMES DAILY.    multivitamin,ther and minerals (MULTI-VITAMIN HP/MINERALS ORAL), Take 1 tablet by mouth daily.    olmesartan (BENICAR) 40 MG tablet,     olopatadine (PATANOL) 0.1 % ophthalmic solution,     olmesartan (BENICAR) 40 MG tablet, Take 1 tablet (40 mg total) by mouth daily.    OBJECTIVE  This was a phone visit.    ------------------------------------------------------------

## 2022-05-11 ENCOUNTER — Institutional Professional Consult (permissible substitution)
Admit: 2022-05-11 | Discharge: 2022-05-12 | Payer: MEDICARE | Attending: Hematology & Oncology | Primary: Hematology & Oncology

## 2022-05-11 DIAGNOSIS — C4359 Malignant melanoma of other part of trunk: Principal | ICD-10-CM

## 2022-05-11 MED ORDER — SPIRONOLACTONE 25 MG TABLET
ORAL_TABLET | Freq: Every day | ORAL | 3 refills | 90.00000 days | Status: CP
Start: 2022-05-11 — End: 2023-05-11

## 2022-05-11 NOTE — Unmapped (Signed)
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 Issues to be discussed:  -While it is more than likely that after 2 weeks of her being off enco-bini she has none of these drugs in her system, she still suffers from their effects on her body. Based on clinical ground alone (still hypertensive episodes, feeling 50% ready) and her advanced age, and her high anxiety level (that leads to less self-confidence) I think it is prudent to delay starting the second cycle of enco-bini by a week.   -By next week we will aim to have a more consistent blood pressure control. I think hydralazine may not have the best record. We will therefore switch prn hydralazine for 25mg  of aldactone. This is expected to have a slower onset; but her blood pressure may be more controlled before she starts enco-bini.  -We will contemplate even a lower dose of encorafenib (150mg  every day) over even a short duration (10 days vs. 14 days).     We will talk to her in a week. We will aim to have blood work before she starts the second cycle.    I spent 15 minutes discussing side effects and future oncology plans.    Diagnosis: Melanoma, cutaneous primary  Other: None  Stage: M1b  Current Therapy: Enco-bini q2 weeks      REFERRING PROVIDER  Self-referral    ONCOLOGY HISTORY   -mid summer 2021; changing (raising, itching) L upper back lesion  -10/12/19; melanoma, superficial spreading type, Breslow thickness 3.70mm, no ulceration, no satellitosis, 11 mitoses/mm2, no regression, non-brisk tumor-infiltrating lymphocytes ([id number]).  -11/04/19;  wide excision and sentinel lymph node mapping c/w residual melanoma, superficial spreading type, Breslow thickness 0.2 mm, 0 mitoses per millimeter square, small incidental compound nevus.  Single left axillary sentinel lymph node was removed and was positive for endometrial metastatic foci of melanoma (sizes of 2 mm, 0.8 mm, and 0.1 mm).  -11/30/19;  significant for a 6 mm left thyroid lobe nodule partially calcified and mildly FDG avid.  Mild FDG avidity in the left axilla of the largest left axillary lymph node measuring 1.4 cm (SUV max of 2.8).  -02/01/20; started adjuvant single-agent nivolumab  -02/19/20; last (second) dose of nivolumab (120mg ); treatment was held due to development of constitutional symptoms.   -07/07/20; punch mid back lesion showed metastatic melanoma ([id number], [facility]).   -08/03/21; palliative resection of the L upper back lump c/w metastatic melanoma  ([id number], [facility]).   -11/28/21; cancer progression to lungs, soft tissues.  -04/12/22; started enco-bini (04/09/22)    SUBJECTIVE  Taylor Adkins is a 89 y.o. female who presents on day 28 on low-dose enco-bini for her BRAFV600K-metastatic melanoma. I confirmed that patient was located at her house within the state of [place] at the time of the phone visit. I also obtained her verbal consent to review interim events and medication changes from her medical record. She has now stopped enco-bini for 2 weeks. She states that she feels 50% compared to the way she felt immediately before she started the pills a month ago. Although she says she is the same as last week, she admits she has a couple of good days. Her granddaughter came and they walked for 10 minutes. She felt weak after she walked. He also admits she feels less dizzy. She has been using hydralazine less frequently, at most once a day. For exampled, her blood pressure today was 158/66. She also  admits that she occasionally has chest pressure but this may be because of her anxiety. Of note, she was started on sertalline in 3/1.     MEDICATIONS    amLODIPine (NORVASC) 10 MG tablet, Take 1 tablet (10 mg total) by mouth.    atorvastatin (LIPITOR) 20 MG tablet,     binimetinib (MEKTOVI) 15 mg tablet, Take 1 tablet (15 mg total) by mouth two (2) times a day.    encorafenib (BRAFTOVI) 75 mg capsule, Take 3 capsules (225 mg total) by mouth daily.    hydrALAZINE (APRESOLINE) 10 MG tablet, Take 1 tablet (10 mg total) by mouth Three (3) times a day. PRN    hydroCHLOROthiazide (HYDRODIURIL) 25 MG tablet,     metoprolol tartrate (LOPRESSOR) 25 MG tablet, TAKE 0.5 TABLETS BY MOUTH 2 TIMES DAILY.    multivitamin,ther and minerals (MULTI-VITAMIN HP/MINERALS ORAL), Take 1 tablet by mouth daily.    olmesartan (BENICAR) 40 MG tablet,     olopatadine (PATANOL) 0.1 % ophthalmic solution,     olmesartan (BENICAR) 40 MG tablet, Take 1 tablet (40 mg total) by mouth daily.    OBJECTIVE  This was a phone visit.

## 2022-05-14 MED ORDER — SPIRONOLACTONE 25 MG TABLET
ORAL_TABLET | Freq: Every day | ORAL | 3 refills | 90 days | Status: CP
Start: 2022-05-14 — End: 2023-05-14

## 2022-05-18 ENCOUNTER — Ambulatory Visit
Admit: 2022-05-18 | Discharge: 2022-05-19 | Payer: MEDICARE | Attending: Hematology & Oncology | Primary: Hematology & Oncology

## 2022-05-18 NOTE — Unmapped (Signed)
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 We have delayed cycle 2 of enco-bini because she has been having outstanding issues, beyond the timepoint that she was expected to have enco-bini levels in her system. Issues to be discussed:  -I am still surprised by the continuing symptoms that she expresses. For example, I am wondering about the continuing lightheadedness and in particular whether this side effect is from other drugs (e.g., CBD oil). I have therefore asked her to stop it for now.  -I made a considerable effort to understand whether other side effects are related to residual effect from enco-bini or due to anxiety/depression/insomnia. With good blood pressure control (definitely reflecting no pharmacodynamic effect from MEK inhibitor) I think fatigue may be more from lack of sleep and less from anticancer pills or the progressing cancer itself. I asked her whether it is really worth the effort to treat the cancer with drugs, or the anxiety from taking the drugs is worse. She felt that if the anticancer treatment leads to a shrinkage of the cancer on the upcoming scans, that would lessen her anxiety and improve her depression. I responded by saying that if she receives another week of enco-bini that would better ensure shrinkage. She has agreed to one more week of enco-bini; at this time we will reduce the enco dose to 150mg  every day and bini to 15 mg bid.    We will see her in a week with whole body PET/CT scans.    I spent 15 min discussing her symptoms, and future oncology plans.     Diagnosis: Melanoma, cutaneous primary  Other: None  Stage: M1b  Current Therapy: Enco-bini q2 weeks      REFERRING PROVIDER  Self-referral    ONCOLOGY HISTORY   -mid summer 2021; changing (raising, itching) L upper back lesion  -10/12/19; melanoma, superficial spreading type, Breslow thickness 3.14mm, no ulceration, no satellitosis, 11 mitoses/mm2, no regression, non-brisk tumor-infiltrating lymphocytes ([id number]).  -11/04/19;  wide excision and sentinel lymph node mapping c/w residual melanoma, superficial spreading type, Breslow thickness 0.2 mm, 0 mitoses per millimeter square, small incidental compound nevus.  Single left axillary sentinel lymph node was removed and was positive for endometrial metastatic foci of melanoma (sizes of 2 mm, 0.8 mm, and 0.1 mm).  -11/30/19;  significant for a 6 mm left thyroid lobe nodule partially calcified and mildly FDG avid.  Mild FDG avidity in the left axilla of the largest left axillary lymph node measuring 1.4 cm (SUV max of 2.8).  -02/01/20; started adjuvant single-agent nivolumab  -02/19/20; last (second) dose of nivolumab (120mg ); treatment was held due to development of constitutional symptoms.   -07/07/20; punch mid back lesion showed metastatic melanoma ([id number], [facility]).   -08/03/21; palliative resection of the L upper back lump c/w metastatic melanoma  ([id number], [facility]).   -11/28/21; cancer progression to lungs, soft tissues.  -04/12/22; started enco-bini (04/09/22)    SUBJECTIVE  Taylor Adkins is a 89 y.o. female who presents on day 74 (I.e. week 5; 2 weeks-on, three weeks-off) after she started on low-dose enco-bini for her BRAFV600K-metastatic melanoma. I confirmed that patient was located at her house within the state of [place] at the time of the phone visit. I also obtained her verbal consent to review interim events and medication changes from her medical record.     She has now stopped enco-bini for 3 weeks. She states that she has not  felt better compared to last week; she continues to feel lightheaded, perhaps worse than before. She has been on aldactone for a week now and her blood pressure is better; on average 130-16-/60-81. Aldactone makes are urinate more; she has not required to use hydralazine over the last week. She has been more tired since last week. She admits that she is depressed (the cancer is in my mind). She is fatigued. She has not walked much outside. Her appetite is low; she does not sleep very well due to anxiety. Denies ha, double vision, changes in bowel movements. Other systems were reviewed and were negative.    MEDICATIONS    amLODIPine (NORVASC) 10 MG tablet, Take 1 tablet (10 mg total) by mouth.    atorvastatin (LIPITOR) 20 MG tablet,     aldactone 25 MG, tablet every day.    hydroCHLOROthiazide (HYDRODIURIL) 25 MG tablet,     metoprolol tartrate (LOPRESSOR) 25 MG tablet, TAKE 0.5 TABLETS BY MOUTH 2 TIMES DAILY.    multivitamin,ther and minerals (MULTI-VITAMIN HP/MINERALS ORAL), Take 1 tablet by mouth daily.    olmesartan (BENICAR) 40 MG tablet,     olopatadine (PATANOL) 0.1 % ophthalmic solution,     olmesartan (BENICAR) 40 MG tablet, Take 1 tablet (40 mg total) by mouth daily.    OBJECTIVE  This was a phone visit.

## 2022-05-21 ENCOUNTER — Ambulatory Visit (INDEPENDENT_AMBULATORY_CARE_PROVIDER_SITE_OTHER): Payer: Medicare Other

## 2022-05-21 DIAGNOSIS — R55 Syncope and collapse: Secondary | ICD-10-CM

## 2022-05-23 LAB — CUP PACEART REMOTE DEVICE CHECK
Date Time Interrogation Session: 20240317230602
Implantable Pulse Generator Implant Date: 20230613

## 2022-05-25 ENCOUNTER — Ambulatory Visit
Admit: 2022-05-25 | Discharge: 2022-05-25 | Payer: MEDICARE | Attending: Hematology & Oncology | Primary: Hematology & Oncology

## 2022-05-25 ENCOUNTER — Ambulatory Visit: Admit: 2022-05-25 | Discharge: 2022-05-25 | Payer: MEDICARE

## 2022-05-25 DIAGNOSIS — C4359 Malignant melanoma of other part of trunk: Principal | ICD-10-CM

## 2022-05-25 LAB — CBC W/ AUTO DIFF
BASOPHILS ABSOLUTE COUNT: 0 10*9/L (ref 0.0–0.1)
BASOPHILS RELATIVE PERCENT: 0.5 %
EOSINOPHILS ABSOLUTE COUNT: 0 10*9/L (ref 0.0–0.5)
EOSINOPHILS RELATIVE PERCENT: 0.6 %
HEMATOCRIT: 37.9 % (ref 34.0–44.0)
HEMOGLOBIN: 12.9 g/dL (ref 11.3–14.9)
LYMPHOCYTES ABSOLUTE COUNT: 1.4 10*9/L (ref 1.1–3.6)
LYMPHOCYTES RELATIVE PERCENT: 26.7 %
MEAN CORPUSCULAR HEMOGLOBIN CONC: 33.9 g/dL (ref 32.0–36.0)
MEAN CORPUSCULAR HEMOGLOBIN: 30.8 pg (ref 25.9–32.4)
MEAN CORPUSCULAR VOLUME: 90.9 fL (ref 77.6–95.7)
MEAN PLATELET VOLUME: 8.5 fL (ref 6.8–10.7)
MONOCYTES ABSOLUTE COUNT: 0.5 10*9/L (ref 0.3–0.8)
MONOCYTES RELATIVE PERCENT: 9.4 %
NEUTROPHILS ABSOLUTE COUNT: 3.2 10*9/L (ref 1.8–7.8)
NEUTROPHILS RELATIVE PERCENT: 62.8 %
PLATELET COUNT: 250 10*9/L (ref 150–450)
RED BLOOD CELL COUNT: 4.17 10*12/L (ref 3.95–5.13)
RED CELL DISTRIBUTION WIDTH: 14 % (ref 12.2–15.2)
WBC ADJUSTED: 5.2 10*9/L (ref 3.6–11.2)

## 2022-05-25 LAB — COMPREHENSIVE METABOLIC PANEL
ALBUMIN: 3.9 g/dL (ref 3.4–5.0)
ALKALINE PHOSPHATASE: 45 U/L — ABNORMAL LOW (ref 46–116)
ALT (SGPT): 20 U/L (ref 10–49)
ANION GAP: 4 mmol/L — ABNORMAL LOW (ref 5–14)
AST (SGOT): 34 U/L (ref <=34)
BILIRUBIN TOTAL: 0.6 mg/dL (ref 0.3–1.2)
BLOOD UREA NITROGEN: 55 mg/dL — ABNORMAL HIGH (ref 9–23)
BUN / CREAT RATIO: 65
CALCIUM: 9.4 mg/dL (ref 8.7–10.4)
CHLORIDE: 108 mmol/L — ABNORMAL HIGH (ref 98–107)
CO2: 28 mmol/L (ref 20.0–31.0)
CREATININE: 0.84 mg/dL
EGFR CKD-EPI (2021) FEMALE: 65 mL/min/{1.73_m2} (ref >=60)
GLUCOSE RANDOM: 86 mg/dL (ref 70–179)
POTASSIUM: 4.9 mmol/L — ABNORMAL HIGH (ref 3.4–4.8)
PROTEIN TOTAL: 7.5 g/dL (ref 5.7–8.2)
SODIUM: 140 mmol/L (ref 135–145)

## 2022-05-25 MED ADMIN — Fluorine F-18 FDG 4-40 mCi IV: 8.75 | INTRAVENOUS | @ 14:00:00 | Stop: 2022-05-25

## 2022-05-25 NOTE — Unmapped (Addendum)
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 Over the last 6 weeks she has received 3 weeks of enco-bini and she has remained 3 weeks off. Issues to be discussed:    - We are pleased with today's scans, suggesting that at this low dose and with 50% exposure we are able to see shrinkage. Notable is the overall heterogeneity of lesions; although the majority of these have shrunk, a single lung nodule slightly grew.  - Responses have come with a toll on her body and there are definite side effects to enco-bini. I have recommended for her to remain off enco-bini for the next 3 weeks.   -She really needs to make a personal assessment of the pros and cons to the cancer therapy since we are tallking about a lifelong therapy; this may help her anxiety and prospect for life since part of her overall symptoms are driven by anxiety (but not only).  - Recommended to restart CBD oil since this helps her sleep.  -We are pleased with better blood pressure control on aldactone.    We are looking for a strategy that would administer enco 150mg  qd and bini 15mg  bid one week-on, 3 weeks off.     I will see her back in 3 weeks after her treatment break.     Diagnosis: Melanoma, cutaneous primary  Other: None  Stage: M1b  Current Therapy: Enco-bini q4 weeks      REFERRING PROVIDER  Self-referral    ONCOLOGY HISTORY   -mid summer 2021; changing (raising, itching) L upper back lesion  -10/12/19; melanoma, superficial spreading type, Breslow thickness 3.62mm, no ulceration, no satellitosis, 11 mitoses/mm2, no regression, non-brisk tumor-infiltrating lymphocytes ([id number]).  -11/04/19;  wide excision and sentinel lymph node mapping c/w residual melanoma, superficial spreading type, Breslow thickness 0.2 mm, 0 mitoses per millimeter square, small incidental compound nevus.  Single left axillary sentinel lymph node was removed and was positive for endometrial metastatic foci of melanoma (sizes of 2 mm, 0.8 mm, and 0.1 mm).  -11/30/19;  significant for a 6 mm left thyroid lobe nodule partially calcified and mildly FDG avid.  Mild FDG avidity in the left axilla of the largest left axillary lymph node measuring 1.4 cm (SUV max of 2.8).  -02/01/20; started adjuvant single-agent nivolumab  -02/19/20; last (second) dose of nivolumab (120mg ); treatment was held due to development of constitutional symptoms.   -07/07/20; punch mid back lesion showed metastatic melanoma ([id number], [facility]).   -08/03/21; palliative resection of the L upper back lump c/w metastatic melanoma  ([id number], [facility]).   -11/28/21; cancer progression to lungs, soft tissues.  -04/12/22; started enco-bini (04/09/22)    SUBJECTIVE  Taylor Adkins is a 89 y.o. female who presents to discuss restaging scans while on low- and intermittent dose of enco-bini for her BRAFV600K-metastatic melanoma. She just completed one week of encorafenib 150mg  every day, and binimetinib 15 mg bid. Stopping the CBD gummies did not improve her lightheadedness but worsened her sleep. Her blood pressure is good after she started the aldactone (which makes her go to the bathroom more frequently); she has noticed that blood pressure can actually run lower than her normal 110/60. Her appetite remains low and she has lost some weight. She is not strong enough to go out for walks. She continues to be stressed about the cancer drugs, their effects on her, and more importantly whether the cancer has responded to them. She denies ha, n, v, diarrhea, abdominal pain, chest pain, palpitations. Other systems  were reviewed and were negative.    MEDICATIONS    amLODIPine (NORVASC) 10 MG tablet, Take 1 tablet (10 mg total) by mouth.    atorvastatin (LIPITOR) 20 MG tablet,     binimetinib (MEKTOVI) 15 mg tablet, Take 1 tablet (15 mg total) by mouth two (2) times a day.    encorafenib (BRAFTOVI) 75 mg capsule, Take 3 capsules (225 mg total) by mouth daily.    hydrALAZINE (APRESOLINE) 10 MG tablet, Take 1 tablet (10 mg total) by mouth Three (3) times a day. PRN    hydroCHLOROthiazide (HYDRODIURIL) 25 MG tablet,     metoprolol tartrate (LOPRESSOR) 25 MG tablet, TAKE 0.5 TABLETS BY MOUTH 2 TIMES DAILY.    multivitamin,ther and minerals (MULTI-VITAMIN HP/MINERALS ORAL), Take 1 tablet by mouth daily.    olmesartan (BENICAR) 40 MG tablet,     olopatadine (PATANOL) 0.1 % ophthalmic solution,     olmesartan (BENICAR) 40 MG tablet, Take 1 tablet (40 mg total) by mouth daily.    OBJECTIVE  GENERAL:  Taylor Adkins appears alert, oriented, and cooperative.  Weight 54.7kg, Temp 36.3, HR 63, RR 16, BP 152/60, O2SATs 99%, PERFORMANCE STATUS ECOG 1-2  Skin with no rash. Long-standing keratotic lesions in sun-exposed areas, no suspicious pigmented lesions that require any excision  No sinus tenderness, mouth is benign, no cervical or supraclavicular lymphadenopathy  Lungs are clear to auscultation, heart is regular rate and rhythm, normal S1/S2, no murmur  BACK well-healed scar on the L flank area without satellite or in-transit disease  Abdomen is soft, nontender, no organomegaly, no inguinal adenopathy  Extremities with no edema, no calf tenderness.    LABORATORY DATA  CBC w/diff and CHEM20 were performed today and were within normal limits, except for K 4.9, Chlor 108, BUN 55 (up from 24 in 04/27/22),       RADIOGRAPHIC DATA:  PET CT whole body performed today (3/22) showed interval decrease in size and avidity of previously described right perifissural pulmonary nodule (currently 0.6 cm, previously 1.6 cm). The L axillary and R paratracheal lymph nodes do not demonstrated FDG uptake. A LUL nodule has decrease (0.7 cm, previously 0.9 cm), a RLL nodule currently measures 0.8 cm (previously 0.9 cm); but another RUL nodule has slightly increased (0.9 cm from 0.8 cm, previously). Small hiatal hernia, cholelithiasis, stable bilateral thyroid nodules, colonic diverticulosis. Multilevel degenerative changed of the spine with an L4 vertebral body hemangioma.     I attest that I, Corinda Gubler, personally documented this note while acting as scribe for [employer], MD.      Corinda Gubler, Scribe.  05/25/2022   ______________________________________________      Documentation assistance provided by Medical Scribe, Corinda Gubler, who was present during the entirety of the visit. I reviewed the note below and validated all of the information provided to ensure accuracy and completeness.     Sonnet Rizor, MD    ------------------------------------------------------------

## 2022-05-25 NOTE — Unmapped (Signed)
Patient scored a 4 on her PHQ-2 and a 15 on her PHQ-9. She did not want to see a Child psychotherapist today. She is currently taking 50 mg sertraline, prescribed by her PCP. I made her aware of the supportive services available for cancer patients at [facility] and that if she changed her mind she could always access them.

## 2022-05-29 ENCOUNTER — Other Ambulatory Visit: Payer: Medicare Other

## 2022-05-29 DIAGNOSIS — Z515 Encounter for palliative care: Secondary | ICD-10-CM

## 2022-05-29 NOTE — Progress Notes (Signed)
COMMUNITY PALLIATIVE CARE SW NOTE  PATIENT NAME: Brandy Miranda DOB: 1927/02/04 MRN: [id number]  PRIMARY CARE PROVIDER: Shon Baton, MD  RESPONSIBLE PARTY:  Acct ID - Guarantor Home Phone Work Phone Relationship Acct Type  [id number] [facility] A [phone]  Self P/F     [address], [place] [id number]   Initial Palliative Care Encounter/Clinical Social Work   [employer] SW completed an initial palliative care encounter with her daughter-Cathy . SW provided education regarding the palliative care program, services, visit frequency and contact information. [name] was receptive to palliative care services, but wanted to talk with her mother and sister more about it. She agreed that she will call SW back to schedule. [name] reported that her mother is very reluctant to talk to anyone or do anything. Her mother is very anxious and she is having trouble sleeping.   No other concerns noted.  Social History   Tobacco Use   Smoking status: Never   Smokeless tobacco: Never  Substance Use Topics   Alcohol use: Yes    Comment: occ    CODE STATUS: Full ADVANCED DIRECTIVES: Yes MOST FORM COMPLETE:  No HOSPICE EDUCATION PROVIDED: No  Duration of encounter and documentation: 30 minutes  [employer], LCSW

## 2022-05-31 NOTE — Progress Notes (Signed)
[facility] Summary Report / Loop Recorder 

## 2022-06-04 DIAGNOSIS — C4359 Malignant melanoma of other part of trunk: Principal | ICD-10-CM

## 2022-06-15 ENCOUNTER — Ambulatory Visit
Admit: 2022-06-15 | Discharge: 2022-06-16 | Payer: MEDICARE | Attending: Hematology & Oncology | Primary: Hematology & Oncology

## 2022-06-15 NOTE — Unmapped (Signed)
ASSESSMENT AND PLAN  Metastatic melanoma that is currently asymptomatic, resistant to single-agent nivolumab as of 2 1/2 years ago. Her locoregional disease was managed with local surgeries and an overall kick the can down the road approach. She has presented with new distant-metastatic disease over the last 8 months. She was found to have a BRAFV600K mutation. This has been progressing on successive scans. She has started on enco-bini 2 months ago on the basis of acceptable side effect profile in case series from elderly individuals (>80 yo; [facility] Melanoma Res 2021) and she has received a total of 3 weeks over the last 9 week timeframe. Issues to be discussed:  -With a one-week exposure it still takes her 2-3 weeks to recover. Ii believe that she could possibly receive 1 week of enco-bini every 5-6 weeks.  -Under this pattern of side effects, I proposed that she does not restart enco-bini 150-15 for another week. This is a good plan for her because she will go to the beach next week.    There are no particular issues to discuss. We are pleased with the good control of her blood pressure on aldactone.  I will phone in 2 weeks, immediately after the one week of enco-bini. The big plan would be for her to have another week of enco-bini during late 07/2022 and repeat scans in 2 months.    I spent 15 min discussing interim events, reviewed hospital records, and discuss future oncology plans.     Diagnosis: Melanoma, cutaneous primary  Other: None  Stage: M1b  Current Therapy: Enco-bini (150-15) q4 weeks      REFERRING PROVIDER  Self-referral    ONCOLOGY HISTORY  -mid summer 2021; changing (raising, itching) L upper back lesion  -10/12/19; melanoma, superficial spreading type, Breslow thickness 3.105mm, no ulceration, no satellitosis, 11 mitoses/mm2, no regression, non-brisk tumor-infiltrating lymphocytes ([id number]).  -11/04/19;  wide excision and sentinel lymph node mapping c/w residual melanoma, superficial spreading type, Breslow thickness 0.2 mm, 0 mitoses per millimeter square, small incidental compound nevus.  Single left axillary sentinel lymph node was removed and was positive for endometrial metastatic foci of melanoma (sizes of 2 mm, 0.8 mm, and 0.1 mm).  -11/30/19;  significant for a 6 mm left thyroid lobe nodule partially calcified and mildly FDG avid.  Mild FDG avidity in the left axilla of the largest left axillary lymph node measuring 1.4 cm (SUV max of 2.8).  -02/01/20; started adjuvant single-agent nivolumab  -02/19/20; last (second) dose of nivolumab (120mg ); treatment was held due to development of constitutional symptoms.   -07/07/20; punch mid back lesion showed metastatic melanoma ([id number], [facility]).   -08/03/21; palliative resection of the L upper back lump c/w metastatic melanoma  ([id number], [facility]).   -11/28/21; cancer progression to lungs, soft tissues.  -04/12/22; started enco-bini (04/09/22)       SUBJECTIVE  Taylor Adkins is a 89 y.o. female is phoned in to discuss her recovery from the recent (3 weeks ago) completion of one week of enco-bini. I confirmed that patient was located at her house within the state of [place] at the time of the phone visit. I also obtained her verbal consent to review interim events and medication changes from her medical record. Since last visit with me she had rough first 2 weeks; she had been very fatigued, more lightheaded, unsteady. Her sleeping has been better as a result of her being off enco/bini and taking hydroxyzine. She has not been walking outside, feeling lazy. She denies  SOB. She continues to get depressed but less so, given that she shows that the anticancer pills have an effect. She continues to have low appetite and forces herself to eat. Her blood pressure has been good over the last 2 weeks (120-160/60-70s). She goes to the bathroom more frequently. She denies cough or ha. Other systems were reviewed and were negative.    MEDICATIONS amLODIPine (NORVASC) 10 MG tablet, Take 1 tablet (10 mg total) by mouth.    atorvastatin (LIPITOR) 20 MG tablet,     hydroCHLOROthiazide (HYDRODIURIL) 25 MG tablet,     metoprolol tartrate (LOPRESSOR) 25 MG tablet, TAKE 0.5 TABLETS BY MOUTH 2 TIMES DAILY.    multivitamin,ther and minerals (MULTI-VITAMIN HP/MINERALS ORAL), Take 1 tablet by mouth daily.    olmesartan (BENICAR) 40 MG tablet,     olopatadine (PATANOL) 0.1 % ophthalmic solution,     olmesartan (BENICAR) 40 MG tablet, Take 1 tablet (40 mg total) by mouth daily.    spironolactone (ALDACTONE) 25 MG tablet, Take 1 tablet by mouth daily    hydroxyziine for sleep    OBJECTIVE  This was a phone visit.

## 2022-06-25 ENCOUNTER — Ambulatory Visit (INDEPENDENT_AMBULATORY_CARE_PROVIDER_SITE_OTHER): Payer: Medicare Other

## 2022-06-25 DIAGNOSIS — R55 Syncope and collapse: Secondary | ICD-10-CM | POA: Diagnosis not present

## 2022-06-26 LAB — CUP PACEART REMOTE DEVICE CHECK
Date Time Interrogation Session: 20240419230419
Implantable Pulse Generator Implant Date: 20230613

## 2022-07-03 ENCOUNTER — Ambulatory Visit
Admit: 2022-07-03 | Discharge: 2022-07-04 | Payer: MEDICARE | Attending: Hematology & Oncology | Primary: Hematology & Oncology

## 2022-07-04 ENCOUNTER — Other Ambulatory Visit: Payer: Medicare Other

## 2022-07-04 VITALS — BP 100/64 | HR 66 | Temp 97.5°F

## 2022-07-04 DIAGNOSIS — Z515 Encounter for palliative care: Secondary | ICD-10-CM

## 2022-07-04 NOTE — Unmapped (Signed)
ASSESSMENT AND PLAN  Metastatic melanoma that is currently asymptomatic, resistant to single-agent nivolumab as of 2 1/2 years ago. Her locoregional disease was managed with local surgeries and an overall kick the can down the road approach. She has presented with new distant-metastatic disease over the last 8 months. She was found to have a BRAFV600K mutation. This has been progressing on successive scans. She has started on enco-bini 3 months ago on the basis of acceptable side effect profile in case series from elderly individuals (>25 yo; [facility] Melanoma Res 2021) and she has received a total of 3 weeks over the last 9 week timeframe. Issues to be discussed:   -We are pleased that she tolerated the recent one week of enco-bini well or at least with no worse symptoms than in the past.  -We will not change her current medication for hypertension and sleeping.  We would anticipate that she will be off enco-bini for another 3 weeks. We would aim for her to have one more week of enco-bini after that. THen we would aim to repeat the scan in 6 weeks. We are hopeful that her melanoma would remain at least stable even with this less prolonged exposure (1-week-one, 4-weeks-off).    I spent 15 min discussing her symptoms and discuss future oncology plan.s    Diagnosis: Melanoma, cutaneous primary  Other: None  Stage: M1b  Current Therapy: Enco-bini (150-15) q4 weeks      REFERRING PROVIDER  Self-referral    ONCOLOGY HISTORY  -mid summer 2021; changing (raising, itching) L upper back lesion  -10/12/19; melanoma, superficial spreading type, Breslow thickness 3.57mm, no ulceration, no satellitosis, 11 mitoses/mm2, no regression, non-brisk tumor-infiltrating lymphocytes ([id number]).  -11/04/19;  wide excision and sentinel lymph node mapping c/w residual melanoma, superficial spreading type, Breslow thickness 0.2 mm, 0 mitoses per millimeter square, small incidental compound nevus.  Single left axillary sentinel lymph node was removed and was positive for endometrial metastatic foci of melanoma (sizes of 2 mm, 0.8 mm, and 0.1 mm).  -11/30/19;  significant for a 6 mm left thyroid lobe nodule partially calcified and mildly FDG avid.  Mild FDG avidity in the left axilla of the largest left axillary lymph node measuring 1.4 cm (SUV max of 2.8).  -02/01/20; started adjuvant single-agent nivolumab  -02/19/20; last (second) dose of nivolumab (120mg ); treatment was held due to development of constitutional symptoms.   -07/07/20; punch mid back lesion showed metastatic melanoma ([id number], [facility]).   -08/03/21; palliative resection of the L upper back lump c/w metastatic melanoma  ([id number], [facility]).   -11/28/21; cancer progression to lungs, soft tissues.  -04/12/22; started enco-bini (04/09/22)    SUBJECTIVE  Taylor Adkins is a 89 y.o. female who is phoned in to follow her recovery from the one week of enco-bini 10 days. I confirmed that at the time of the visit she was located at home in [place]. I obtained her verbal consent to review her chart. She enjoyed an extra week of delaying restarting enco-bini. Her mood has been better and she has been sleeping better with hydroxyzine. Hydroxyzine does not confuse her. Her lightheadedness remains the same. She denies any aches, chest pain, palpitations. Her physical activity remains limited and she does not go out to walk as she used to.   She denies any headaches. She continues to urinate more while on aldactone but her blood pressure is under better control. She denies any new  lumps. Other systems were reviewed and were negative.  MEDICATIONS    amLODIPine (NORVASC) 10 MG tablet, Take 1 tablet (10 mg total) by mouth.    atorvastatin (LIPITOR) 20 MG tablet,     hydroCHLOROthiazide (HYDRODIURIL) 25 MG tablet,     metoprolol tartrate (LOPRESSOR) 25 MG tablet, TAKE 0.5 TABLETS BY MOUTH 2 TIMES DAILY.    multivitamin,ther and minerals (MULTI-VITAMIN HP/MINERALS ORAL), Take 1 tablet by mouth daily.    olmesartan (BENICAR) 40 MG tablet,     olopatadine (PATANOL) 0.1 % ophthalmic solution,     olmesartan (BENICAR) 40 MG tablet, Take 1 tablet (40 mg total) by mouth daily.    spironolactone (ALDACTONE) 25 MG tablet, Take 1 tablet by mouth daily    hydroxyziine for sleep    OBJECTIVE  This was a phone visit.

## 2022-07-04 NOTE — Progress Notes (Signed)
[facility] Summary Report / Loop Recorder 

## 2022-07-04 NOTE — Progress Notes (Signed)
PATIENT NAME: RATASHA FICKEN DOB: 09/04/1926 MRN: [id number]  PRIMARY CARE PROVIDER: Creola Corn, MD  RESPONSIBLE PARTY:  Acct ID - Guarantor Home Phone Work Phone Relationship Acct Type  [id number] [facility] A [phone]  Self P/F     [address], [place] [address]   Palliative Care Initial Encounter Note   Completed home visit. Daughter  Olegario Messier also present.        TODAY'S VISIT:  Respiratory: denies SOB today and none in the past  Cardiac: has a Loop Recorder; no edema noted  Cognitive: alert and oriented x4   Appetite: eats 3 reduced meals; when she doesn't feel like eating she will have a protein drink   GI/GU: has a daily BM; no urinary issues   Mobility: independent  ADLs: independent with grooming, dressing, bathing; has a house cleaner; but makes her own bed   Sleeping Pattern: sleeps throughout the night most nights; will awaken to go to the bathroom but goes back to sleep   Pain: denies pain today; she reports her shoulders get a bit achy when she lifts things that are too heavy for her  Wt: 116 lbs  Palliative Care/ Hospice: LPN explained role and purpose of palliative care including visit frequency. Also discussed benefits of hospice care as well as the differences between the two with patient.   Goals of Care: To stay in the home and have the best quality of life as possible; but if not daughter would have her live with her family    Next Appt Scheduled For: will schedule for 6 weeks when schedule available       CODE STATUS: DNR  ADVANCED DIRECTIVES: Y MOST FORM: N PPS: 80%   PHYSICAL EXAM:   VITALS: Today's Vitals   07/04/22 1347  BP: 100/64  Pulse: 66  Temp: (!) 97.5 F (36.4 C)  TempSrc: Temporal  SpO2: 98%  PainSc: 0-No pain    LUNGS: clear to auscultation  CARDIAC: Cor RRR; pt has a murmur EXTREMITIES: AROM x4 SKIN: Skin color, texture, turgor normal. No rashes or lesions  NEURO: negative       Jeanine Caven  Clementeen Graham, LPN

## 2022-07-11 DIAGNOSIS — C4359 Malignant melanoma of other part of trunk: Principal | ICD-10-CM

## 2022-07-11 MED ORDER — ENCORAFENIB 75 MG CAPSULE
ORAL_CAPSULE | Freq: Every day | ORAL | 11 refills | 30 days | Status: CP
Start: 2022-07-11 — End: ?

## 2022-07-11 MED ORDER — MEKTOVI 15 MG TABLET
ORAL_TABLET | Freq: Two times a day (BID) | ORAL | 11 refills | 30 days | Status: CP
Start: 2022-07-11 — End: ?
  Filled 2022-07-12: qty 60, 30d supply, fill #0

## 2022-07-11 NOTE — Unmapped (Signed)
[facility] Clinical Assessment & Refill Coordination Note    Taylor Adkins, DOB: 1926/09/02  Phone: [phone] (home)     All above HIPAA information was verified with patient.     Was a Nurse, learning disability used for this call? No    Specialty Medication(s):   Hematology/Oncology: Taylor Adkins     Current Outpatient Medications   Medication Sig Dispense Refill    amLODIPine (NORVASC) 10 MG tablet Take 1 tablet (10 mg total) by mouth.      atorvastatin (LIPITOR) 20 MG tablet       binimetinib (MEKTOVI) 15 mg tablet Take 1 tablet (15 mg total) by mouth two (2) times a day. For 1 week on, 4 weeks off. 60 tablet 11    encorafenib (BRAFTOVI) 75 mg capsule Take 2 capsules (150 mg total) by mouth daily. For 1 week on, 4 weeks off. 60 capsule 11    hydrALAZINE (APRESOLINE) 10 MG tablet Take 1 tablet (10 mg total) by mouth Three (3) times a day. (Patient taking differently: Take 1 tablet (10 mg total) by mouth Three (3) times a day. Patient takes if systolic BP is above 170.) 360 tablet 0    hydroCHLOROthiazide (HYDRODIURIL) 25 MG tablet       metoprolol tartrate (LOPRESSOR) 25 MG tablet TAKE 0.5 TABLETS BY MOUTH 2 TIMES DAILY.      multivitamin,ther and minerals (MULTI-VITAMIN HP/MINERALS ORAL) Take 1 tablet by mouth daily.      olmesartan (BENICAR) 40 MG tablet Take 1 tablet (40 mg total) by mouth daily.      olopatadine (PATANOL) 0.1 % ophthalmic solution Apply to eye.      sertraline (ZOLOFT) 50 MG tablet Take 1 tablet (50 mg total) by mouth daily.      spironolactone (ALDACTONE) 25 MG tablet Take 1 tablet (25 mg total) by mouth daily. 90 tablet 3     No current facility-administered medications for this visit.        Changes to medications: [name] reports no changes at this time.    No Known Allergies    Changes to allergies: No    SPECIALTY MEDICATION ADHERENCE     Mektovi 15 mg: 0 days of medicine on hand   Does not need braftovi  Medication Adherence    Patient reported X missed doses in the last month: 0  Specialty Medication: mektovi          Specialty medication(s) dose(s) confirmed: Regimen is correct and unchanged.     Are there any concerns with adherence? No    Adherence counseling provided? Not needed    CLINICAL MANAGEMENT AND INTERVENTION      Clinical Benefit Assessment:    Do you feel the medicine is effective or helping your condition? Yes    Clinical Benefit counseling provided? Not needed    Adverse Effects Assessment:    Are you experiencing any side effects? No    Are you experiencing difficulty administering your medicine? No    Quality of Life Assessment:    Quality of Life      Oncology  1. What impact has your specialty medication had on the reduction of your daily pain or discomfort level?: None  2. On a scale of 1-10, how would you rate your ability to manage side effects associated with your specialty medication? (1=no issues, 10 = unable to take medication due to side effects): 1            How many  days over the past month did your condition/medication  keep you from your normal activities? For example, brushing your teeth or getting up in the morning. 0    Have you discussed this with your provider? Not needed    Acute Infection Status:    Acute infections noted within Epic:  No active infections  Patient reported infection: None    Therapy Appropriateness:    Is therapy appropriate and patient progressing towards therapeutic goals? Yes, therapy is appropriate and should be continued    DISEASE/MEDICATION-SPECIFIC INFORMATION      N/A    Oncology: Is the patient receiving adequate infection prevention treatment? Not applicable  Does the patient have adequate nutritional support? Not applicable    PATIENT SPECIFIC NEEDS     Does the patient have any physical, cognitive, or cultural barriers? No    Is the patient high risk? Yes, patient is taking oral chemotherapy. Appropriateness of therapy as been assessed    Did the patient require a clinical intervention? No    Does the patient require physician intervention or other additional services (i.e., nutrition, smoking cessation, social work)? No    SOCIAL DETERMINANTS OF HEALTH     At [facility], we have learned that life circumstances - like trouble affording food, housing, utilities, or transportation can affect the health of many of our patients.   That is why we wanted to ask: are you currently experiencing any life circumstances that are negatively impacting your health and/or quality of life? No    Social Determinants of Psychologist, prison and probation services Strain: Not on file   Internet Connectivity: Not on file   Food Insecurity: Not on file   Tobacco Use: Low Risk  (05/02/2022)    Received from [facility]    Patient History     Smoking Tobacco Use: Never     Smokeless Tobacco Use: Never     Passive Exposure: Not on file   Housing/Utilities: Not on file   Alcohol Use: Not on file   Transportation Needs: Not on file   Substance Use: Not on file   Health Literacy: Not on file   Physical Activity: Not on file   Interpersonal Safety: Not on file   Stress: Not on file   Intimate Partner Violence: Not on file   Depression: At risk (05/25/2022)    PHQ-2     PHQ-2 Score: 4   Social Connections: Not on file       Would you be willing to receive help with any of the needs that you have identified today? Not applicable       SHIPPING     Specialty Medication(s) to be Shipped:   Hematology/Oncology: Mektovi    Other medication(s) to be shipped: No additional medications requested for fill at this time     Changes to insurance: No    Delivery Scheduled: Yes, Expected medication delivery date: 07/18/22.     Medication will be delivered via UPS to the confirmed prescription address in [facility].    The patient will receive a drug information handout for each medication shipped and additional FDA Medication Guides as required.  Verified that patient has previously received a Conservation officer, historic buildings and a Surveyor, mining.    The patient or caregiver noted above participated in the development of this care plan and knows that they can request review of or adjustments to the care plan at any time.      All  of the patient's questions and concerns have been addressed.    Rollen Sox, [facility]   [facility] Specialty Pharmacist

## 2022-07-11 NOTE — Unmapped (Signed)
Clinical Assessment Needed For: Dose Change  Medication: Braftovi and Mektovi  Last Fill Date/Day Supply: 2-6 / 30  Copay $28.97 (total)  Was previous dose already scheduled to fill: No    Notes to Pharmacist: Credit card not on file

## 2022-07-13 DIAGNOSIS — C4359 Malignant melanoma of other part of trunk: Principal | ICD-10-CM

## 2022-07-19 ENCOUNTER — Encounter: Payer: Self-pay | Admitting: Cardiology

## 2022-07-23 NOTE — Telephone Encounter (Addendum)
Will defer decision of loop recorder monitoring to EP as they are the ones follow this (looks like they said they only need to see her PRN in other mychart msg) but from general cardiac standpoint had previous mild-moderate aortic stenosis and PVCs. We do typically still follow this yearly but if they feel overwhelmed with appointments and prefer to follow-up with PCP, would just be sure to notify us for any new issues. Thank you!

## 2022-07-26 LAB — CUP PACEART REMOTE DEVICE CHECK
Date Time Interrogation Session: 20240522230040
Implantable Pulse Generator Implant Date: 20230613

## 2022-07-27 ENCOUNTER — Ambulatory Visit
Admit: 2022-07-27 | Discharge: 2022-07-28 | Payer: MEDICARE | Attending: Hematology & Oncology | Primary: Hematology & Oncology

## 2022-07-27 NOTE — Unmapped (Addendum)
ASSESSMENT AND PLAN  Metastatic melanoma that is currently asymptomatic, resistant to single-agent nivolumab as of 2 1/2 years ago. Her locoregional disease was managed with local surgeries and an overall kick the can down the road approach. She has presented with new distant-metastatic disease over the last 8 months. She was found to have a BRAFV600K mutation. This has been progressing on successive scans. She has started on enco-bini 3 months ago on the basis of acceptable side effect profile in case series from elderly individuals (>40 yo; [facility] Melanoma Res 2021) and she has received a total of 3 weeks over the last 9 week timeframe. Issues to be discussed:   -For whatever (beautiful) reason, she has been physically, mentally, and spiritually better. She haas been more grateful about life and she has not been depressed. And has recovered faster than before. So, I do not object her starting the 7 days of enco-bini this weekend.   -I recommended her to take herself off hydrochlorothiazide for now. If she noticed that BP remains low, then decrease norvasc in half. If she notices that BP goes up after she starts enco-bini then she can resume hydrochlorothiazide.    We will see her in 3 weeks with restaging CT scans.     I spent 15 min discussing her symptoms and discuss future oncology plans.    Diagnosis: Melanoma, cutaneous primary  Other: None  Stage: M1b  Current Therapy: Enco-bini (150-15) q4 weeks      REFERRING PROVIDER  Self-referral    ONCOLOGY HISTORY  -mid summer 2021; changing (raising, itching) L upper back lesion  -10/12/19; melanoma, superficial spreading type, Breslow thickness 3.63mm, no ulceration, no satellitosis, 11 mitoses/mm2, no regression, non-brisk tumor-infiltrating lymphocytes ([id number]).  -11/04/19;  wide excision and sentinel lymph node mapping c/w residual melanoma, superficial spreading type, Breslow thickness 0.2 mm, 0 mitoses per millimeter square, small incidental compound nevus.  Single left axillary sentinel lymph node was removed and was positive for endometrial metastatic foci of melanoma (sizes of 2 mm, 0.8 mm, and 0.1 mm).  -11/30/19;  significant for a 6 mm left thyroid lobe nodule partially calcified and mildly FDG avid.  Mild FDG avidity in the left axilla of the largest left axillary lymph node measuring 1.4 cm (SUV max of 2.8).  -02/01/20; started adjuvant single-agent nivolumab  -02/19/20; last (second) dose of nivolumab (120mg ); treatment was held due to development of constitutional symptoms.   -07/07/20; punch mid back lesion showed metastatic melanoma ([id number], [facility]).   -08/03/21; palliative resection of the L upper back lump c/w metastatic melanoma  ([id number], [facility]).   -11/28/21; cancer progression to lungs, soft tissues.  -04/12/22; started enco-bini (04/09/22)    SUBJECTIVE  Taylor Adkins is a 89 y.o. female who is phoned in to discuss the possibility of restarting enco-bini again after a 3 week break. I confirmed that at the time of the visit she was located at home in [place]. I obtained her verbal consent to review her chart. She has recovered extremely well and faster than previous one-week enco-bini cycles. She feels less lightheaded, more energetic; she has been working in the yard and she has been walking more. She sleeps better and she exercises more. She has been more hopeful. Her daughter reports that her blood pressures fluctuate from 140/60-100-50. She denies any new lumps, n, v, diarrhea. Other systems were reviewed and were negative.    MEDICATIONS    amLODIPine (NORVASC) 10 MG tablet, Take 1 tablet (10 mg total)  by mouth.    atorvastatin (LIPITOR) 20 MG tablet,     hydroCHLOROthiazide (HYDRODIURIL) 25 MG tablet,     metoprolol tartrate (LOPRESSOR) 25 MG tablet, TAKE 0.5 TABLETS BY MOUTH 2 TIMES DAILY.    multivitamin,ther and minerals (MULTI-VITAMIN HP/MINERALS ORAL), Take 1 tablet by mouth daily.    olmesartan (BENICAR) 40 MG tablet,     olopatadine (PATANOL) 0.1 % ophthalmic solution,     olmesartan (BENICAR) 40 MG tablet, Take 1 tablet (40 mg total) by mouth daily.    spironolactone (ALDACTONE) 25 MG tablet, Take 1 tablet by mouth daily    hydroxyziine for sleep    OBJECTIVE  This was a phone visit.

## 2022-07-31 ENCOUNTER — Ambulatory Visit (INDEPENDENT_AMBULATORY_CARE_PROVIDER_SITE_OTHER): Payer: Medicare Other

## 2022-07-31 ENCOUNTER — Other Ambulatory Visit: Payer: Medicare Other

## 2022-07-31 DIAGNOSIS — Z515 Encounter for palliative care: Secondary | ICD-10-CM

## 2022-07-31 DIAGNOSIS — R55 Syncope and collapse: Secondary | ICD-10-CM

## 2022-08-01 NOTE — Progress Notes (Signed)
PATIENT NAME: TAHNYA JAGGARD DOB: Oct 22, 1926 MRN: [id number]  PRIMARY CARE PROVIDER: Creola Corn, MD  RESPONSIBLE PARTY:  Acct ID - Guarantor Home Phone Work Phone Relationship Acct Type  [id number] [facility] A [phone]  Self P/F     [address], [place] [address]   Palliative Care Initial Encounter Note    I connected with  Einar Gip on 07/31/22 by telephone and verified that I am speaking with the correct person using two identifiers.       TODAY'S VISIT:   Respiratory: denies SOB today   Cardiac: has a Loop Recorder   Cognitive: alert and oriented x4   Appetite: still eats 3 reduced meals; when she doesn't feel like eating she will have a protein drink    GI/GU: has a daily BM; no urinary issues   Mobility: independent   ADLs: still independent with grooming, dressing, bathing; has a house cleaner; but makes her own bed   Sleeping Pattern: still sleeps throughout the night most nights; will awaken to go to the bathroom but goes back to sleep    Pain: denies pain today     Goals of Care: To stay in the home and have the best quality of life as possible; but if not daughter would have her live with her family     Next Appt Scheduled For: 08/29/22 @ 12pm          CODE STATUS: DNR  ADVANCED DIRECTIVES: Y MOST FORM: N PPS: 80%     PHYSICAL EXAM:   VITALS:There were no vitals filed for this visit.  NEURO: negative       Jahmire Ruffins Clementeen Graham, LPN

## 2022-08-01 NOTE — Progress Notes (Signed)
[facility] Summary Report / Loop Recorder 

## 2022-08-03 ENCOUNTER — Other Ambulatory Visit: Payer: Medicare Other

## 2022-08-15 ENCOUNTER — Other Ambulatory Visit: Payer: Medicare Other

## 2022-08-17 ENCOUNTER — Ambulatory Visit: Admit: 2022-08-17 | Discharge: 2022-08-17 | Payer: MEDICARE

## 2022-08-17 ENCOUNTER — Ambulatory Visit
Admit: 2022-08-17 | Discharge: 2022-08-17 | Payer: MEDICARE | Attending: Hematology & Oncology | Primary: Hematology & Oncology

## 2022-08-17 MED ADMIN — Fluorine F-18 FDG 4-40 mCi IV: 5 | INTRAVENOUS | @ 17:00:00 | Stop: 2022-08-17

## 2022-08-20 DIAGNOSIS — C4359 Malignant melanoma of other part of trunk: Principal | ICD-10-CM

## 2022-08-20 MED ORDER — MEKTOVI 15 MG TABLET
ORAL_TABLET | Freq: Two times a day (BID) | ORAL | 11 refills | 30 days | Status: CP
Start: 2022-08-20 — End: ?
  Filled 2022-10-15: qty 60, 90d supply, fill #0

## 2022-08-20 MED ORDER — ENCORAFENIB 75 MG CAPSULE
ORAL_CAPSULE | Freq: Every day | ORAL | 11 refills | 30 days | Status: CP
Start: 2022-08-20 — End: ?
  Filled 2022-08-22: qty 90, 90d supply, fill #0

## 2022-08-20 NOTE — Unmapped (Signed)
[facility] Pharmacist has reviewed a new prescription for Braftovi that indicates a dose decrease.  Patient was counseled on this dosage change by Dr. Harold Hedge- see epic note from 06/15/22.  Next refill call date adjusted if necessary.

## 2022-08-21 NOTE — Unmapped (Signed)
Clinical Assessment Needed For: Dose Change  Medication: Braftovi and Mektovi  Last Fill Date/Day Supply: 5-9 and 2-6 / 30  Copay $0  Was previous dose already scheduled to fill: No    Notes to Pharmacist:

## 2022-08-21 NOTE — Unmapped (Signed)
[facility] Clinical Assessment & Refill Coordination Note    Taylor Adkins, DOB: May 20, 1926  Phone: [phone] (home)     All above HIPAA information was verified with patient's family member, daughter.     Was a Nurse, learning disability used for this call? No    Specialty Medication(s):   Hematology/Oncology: Merlene Morse     Current Outpatient Medications   Medication Sig Dispense Refill    amLODIPine (NORVASC) 10 MG tablet Take 1 tablet (10 mg total) by mouth.      atorvastatin (LIPITOR) 20 MG tablet       binimetinib (MEKTOVI) 15 mg tablet Take 1 tablet (15 mg total) by mouth two (2) times a day. Take for 1 week on, 3 weeks off. 60 tablet 11    encorafenib (BRAFTOVI) 75 mg capsule Take 2 capsules (150 mg total) by mouth daily. Take for 1 week on, 3 weeks off. 60 capsule 11    hydrALAZINE (APRESOLINE) 10 MG tablet Take 1 tablet (10 mg total) by mouth Three (3) times a day. (Patient taking differently: Take 1 tablet (10 mg total) by mouth Three (3) times a day. Patient takes if systolic BP is above 170.) 360 tablet 0    hydroCHLOROthiazide (HYDRODIURIL) 25 MG tablet  (Patient not taking: Reported on 08/17/2022)      metoprolol tartrate (LOPRESSOR) 25 MG tablet TAKE 0.5 TABLETS BY MOUTH 2 TIMES DAILY.      multivitamin,ther and minerals (MULTI-VITAMIN HP/MINERALS ORAL) Take 1 tablet by mouth daily.      olmesartan (BENICAR) 40 MG tablet Take 1 tablet (40 mg total) by mouth daily.      olopatadine (PATANOL) 0.1 % ophthalmic solution Apply to eye.      sertraline (ZOLOFT) 50 MG tablet Take 1 tablet (50 mg total) by mouth daily.      spironolactone (ALDACTONE) 25 MG tablet Take 1 tablet (25 mg total) by mouth daily. 90 tablet 3     No current facility-administered medications for this visit.        Changes to medications: Keyannah reports no changes at this time.    No Known Allergies    Changes to allergies: No    SPECIALTY MEDICATION ADHERENCE     Braftovi 75 mg: 0 days of medicine on hand   Mektovi 15 mg: 30 days of medicine on hand     Medication Adherence    Patient reported X missed doses in the last month: 0  Specialty Medication: Braftovi 75mg   Patient is on additional specialty medications: Yes  Additional Specialty Medications: Mektovi 15mg   Patient Reported Additional Medication X Missed Doses in the Last Month: 0          Specialty medication(s) dose(s) confirmed:  dose of Mektovi is 15mg  2 times a day for 1 week on and 3 weeks off and dose of Braftovi is 150mg  daily for 1 week on and 3 weeks off      Are there any concerns with adherence? No    Adherence counseling provided? Not needed    CLINICAL MANAGEMENT AND INTERVENTION      Clinical Benefit Assessment:    Do you feel the medicine is effective or helping your condition? Yes    Clinical Benefit counseling provided? Not needed    Adverse Effects Assessment:    Are you experiencing any side effects? No    Are you experiencing difficulty administering your medicine? No    Quality of Life Assessment:  Quality of Life      Oncology  1. What impact has your specialty medication had on the reduction of your daily pain or discomfort level?: None  2. On a scale of 1-10, how would you rate your ability to manage side effects associated with your specialty medication? (1=no issues, 10 = unable to take medication due to side effects): 1            How many days over the past month did your condition/medication  keep you from your normal activities? For example, brushing your teeth or getting up in the morning. 0    Have you discussed this with your provider? Not needed    Acute Infection Status:    Acute infections noted within Epic:  No active infections  Patient reported infection: None    Therapy Appropriateness:    Is therapy appropriate and patient progressing towards therapeutic goals? Yes, therapy is appropriate and should be continued    DISEASE/MEDICATION-SPECIFIC INFORMATION      N/A    Oncology: Is the patient receiving adequate infection prevention treatment? Not applicable  Does the patient have adequate nutritional support? Not applicable    PATIENT SPECIFIC NEEDS     Does the patient have any physical, cognitive, or cultural barriers? No    Is the patient high risk? Yes, patient is taking oral chemotherapy. Appropriateness of therapy as been assessed    Did the patient require a clinical intervention? No    Does the patient require physician intervention or other additional services (i.e., nutrition, smoking cessation, social work)? No    SOCIAL DETERMINANTS OF HEALTH     At the [facility], we have learned that life circumstances - like trouble affording food, housing, utilities, or transportation can affect the health of many of our patients.   That is why we wanted to ask: are you currently experiencing any life circumstances that are negatively impacting your health and/or quality of life? No    Social Determinants of Psychologist, prison and probation services Strain: Not on file   Internet Connectivity: Not on file   Food Insecurity: Not on file   Tobacco Use: Low Risk  (07/04/2022)    Received from [facility]    Patient History     Smoking Tobacco Use: Never     Smokeless Tobacco Use: Never     Passive Exposure: Not on file   Housing/Utilities: Not on file   Alcohol Use: Not on file   Transportation Needs: Not on file   Substance Use: Not on file   Health Literacy: Not on file   Physical Activity: Not on file   Interpersonal Safety: Not on file   Stress: Not on file   Intimate Partner Violence: Not on file   Depression: At risk (05/25/2022)    PHQ-2     PHQ-2 Score: 4   Social Connections: Not on file       Would you be willing to receive help with any of the needs that you have identified today? Not applicable       SHIPPING     Specialty Medication(s) to be Shipped:   Hematology/Oncology: Braftovi (does not need Mektovi at this time)    Other medication(s) to be shipped: No additional medications requested for fill at this time Changes to insurance: No    Delivery Scheduled: Yes, Expected medication delivery date: 08/23/22.     Medication will be delivered via UPS to the confirmed prescription address in  Epic WAM.    The patient will receive a drug information handout for each medication shipped and additional FDA Medication Guides as required.  Verified that patient has previously received a Welcome Packet and a Notice of Privacy Practices.    The patient or caregiver noted above participated in the development of this care plan and knows that they can request review of or adjustments to the care plan at any time.      All of the patient's questions and concerns have been addressed.    Maurissa Ambrose, RPH   [facility] Specialty Pharmacist

## 2022-08-24 NOTE — Unmapped (Signed)
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 Over the last 6 weeks she has received 3 weeks of enco-bini and she has remained 3 weeks off. Issues to be discussed:   -We are pleased with the overall stability of her scans today! This is even a bigger success because over the last 95-month interval she merely had a total of 3 weeks worth of enco-bini. More importantly, she has tolerated these pills graciously.   -We will aim to administer one week of enco-bini every 3-4 weeks. I repeated the goal here which is disease stability, cancer control, and preservation of her quality of life.  -I am encouraged by the home BP numbers, not today's visit. She will remain on the same antihypertensives.    I have now provided a leeway for her to receive the next week(s) of enco-bini whenever she feels like. We will be seeing her in 3 months with restaging scans.    Diagnosis: Melanoma, cutaneous primary  Other: None  Stage: M1b  Current Therapy: Enco-bini (150-15) q4 weeks      REFERRING PROVIDER  Self-referral    ONCOLOGY HISTORY  -mid summer 2021; changing (raising, itching) L upper back lesion  -10/12/19; melanoma, superficial spreading type, Breslow thickness 3.65mm, no ulceration, no satellitosis, 11 mitoses/mm2, no regression, non-brisk tumor-infiltrating lymphocytes ([id number]).  -11/04/19;  wide excision and sentinel lymph node mapping c/w residual melanoma, superficial spreading type, Breslow thickness 0.2 mm, 0 mitoses per millimeter square, small incidental compound nevus.  Single left axillary sentinel lymph node was removed and was positive for endometrial metastatic foci of melanoma (sizes of 2 mm, 0.8 mm, and 0.1 mm).  -11/30/19;  significant for a 6 mm left thyroid lobe nodule partially calcified and mildly FDG avid.  Mild FDG avidity in the left axilla of the largest left axillary lymph node measuring 1.4 cm (SUV max of 2.8).  -02/01/20; started adjuvant single-agent nivolumab  -02/19/20; last (second) dose of nivolumab (120mg ); treatment was held due to development of constitutional symptoms.   -07/07/20; punch mid back lesion showed metastatic melanoma ([id number], [facility]).   -08/03/21; palliative resection of the L upper back lump c/w metastatic melanoma  ([id number], [facility]).   -11/28/21; cancer progression to lungs, soft tissues.  -04/12/22; started enco-bini (04/09/22)    SUBJECTIVE  Taylor Adkins is a 89 y.o. female comes to discuss her restaging scans. Since last visit she completed the one-week of low-dose enco-bini 2 weeks ago. She thinks she has been tolerating treatment better than previous one-week intervals. She is energetic, works in the yard, walks. Lightheadedness has been the same during and after the one-week treatment. She denies any ha, n, diarrhea, cough, chest pain,  SOB. Her blood pressure fluctuates but it has never been too high or too low. She remains hopeful. More than anything she has been very appreciative of her life she has, the family she has; especially, now given the fact other friends of her (and her daughter's) in their 3s who were recently diagnosend and/or died from cancer. Other systems were reviewed and were negative.    MEDICATIONS    amLODIPine (NORVASC) 10 MG tablet, Take 1 tablet (10 mg total) by mouth.    atorvastatin (LIPITOR) 20 MG tablet,     hydroCHLOROthiazide (HYDRODIURIL) 25 MG tablet,     metoprolol tartrate (LOPRESSOR) 25 MG tablet, TAKE 0.5 TABLETS BY MOUTH 2 TIMES DAILY.    multivitamin,ther and minerals (MULTI-VITAMIN HP/MINERALS ORAL), Take 1 tablet by mouth daily.    olmesartan (  BENICAR) 40 MG tablet,     olopatadine (PATANOL) 0.1 % ophthalmic solution,     olmesartan (BENICAR) 40 MG tablet, Take 1 tablet (40 mg total) by mouth daily.    spironolactone (ALDACTONE) 25 MG tablet, Take 1 tablet by mouth daily    hydroxyziine for sleep    OBJECTIVE  GENERAL:  Taylor Adkins appears alert, oriented, and cooperative.  Weight 55.9kg (she was 54.7kg in 05/25/22), BP 190/63, HR 77, Temp 36.2,   Skin with no rash. Long-standing keratotic lesions in sun-exposed areas, no suspicious pigmented lesions that require any excision  No sinus tenderness, mouth is benign, no cervical or supraclavicular lymphadenopathy  Lungs are clear to auscultation, heart is regular rate and rhythm, normal S1/S2, no murmur  BACK well-healed scar on the L flank area without satellite or in-transit disease  Abdomen is soft, nontender, no organomegaly, no inguinal adenopathy  Extremities with no edema, no calf tenderness.    RADIOGRAPHIC DATA  Whole body PET/CT scan was performed today. Unchanged single residua L axillary node. Stable bilateral lung nodules (e.g. a RLL 0.8 cm). Mild polypoid mucosal thickening of bilateral maxillary sinuses. Incidental multinodular thyroid. Cholelithiasis and diverticulosis. Mild osteopenia.

## 2022-08-24 NOTE — Progress Notes (Signed)
[facility] Summary Report / Loop Recorder 

## 2022-08-28 LAB — CUP PACEART REMOTE DEVICE CHECK
Date Time Interrogation Session: 20240624230129
Implantable Pulse Generator Implant Date: 20230613

## 2022-08-29 ENCOUNTER — Other Ambulatory Visit: Payer: Medicare Other

## 2022-09-03 ENCOUNTER — Ambulatory Visit (INDEPENDENT_AMBULATORY_CARE_PROVIDER_SITE_OTHER): Payer: Medicare Other

## 2022-09-03 DIAGNOSIS — R55 Syncope and collapse: Secondary | ICD-10-CM

## 2022-09-12 NOTE — Unmapped (Signed)
I spoke with patient Taylor Adkins to confirm appointments on the following date(s): 09/20/22    Merilyn Baba, CNA

## 2022-09-20 ENCOUNTER — Institutional Professional Consult (permissible substitution): Admit: 2022-09-20 | Payer: MEDICARE | Attending: Hematology & Oncology | Primary: Hematology & Oncology

## 2022-09-20 NOTE — Unmapped (Signed)
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 Currently on intermittent enco-bini on a week-on, 3-weeks-off basis. Issues to be discussed:   -We are pleased by her good tolerance to this 1-week-on, 3-weeks-off schedule. She appears, in fact, that she is tolerating it so well that I encouraged her to consider increasing the duration to 8-9 days every 4 weeks. I left it out for her to decide.  -Her blood pressure has been better than ever perhaps due to the use of aldactone. In case her blood pressure runs lower, she was encouraged to drink more water.     Given that she is doing so well, we will cancel phone visit in 4 weeks. She will proceed with the next one week of enco-bini (or 8-9 days) and she will repeat it every 4 weeks. We will see her in 2 months for the next set of restaging scans.    I spent 15 minutes discussing her symptoms and future oncology plans.    Plans to start tomorrow for a week.  To drink more.  Will see you in 2 months.   Diagnosis: Melanoma, cutaneous primary  Other: None  Stage: M1b  Current Therapy: Enco-bini (150-15) q4 weeks      REFERRING PROVIDER  Self-referral    ONCOLOGY HISTORY  -mid summer 2021; changing (raising, itching) L upper back lesion  -10/12/19; melanoma, superficial spreading type, Breslow thickness 3.56mm, no ulceration, no satellitosis, 11 mitoses/mm2, no regression, non-brisk tumor-infiltrating lymphocytes ([id number]).  -11/04/19;  wide excision and sentinel lymph node mapping c/w residual melanoma, superficial spreading type, Breslow thickness 0.2 mm, 0 mitoses per millimeter square, small incidental compound nevus.  Single left axillary sentinel lymph node was removed and was positive for endometrial metastatic foci of melanoma (sizes of 2 mm, 0.8 mm, and 0.1 mm).  -11/30/19;  significant for a 6 mm left thyroid lobe nodule partially calcified and mildly FDG avid.  Mild FDG avidity in the left axilla of the largest left axillary lymph node measuring 1.4 cm (SUV max of 2.8).  -02/01/20; started adjuvant single-agent nivolumab  -02/19/20; last (second) dose of nivolumab (120mg ); treatment was held due to development of constitutional symptoms.   -07/07/20; punch mid back lesion showed metastatic melanoma ([id number], [facility]).   -08/03/21; palliative resection of the L upper back lump c/w metastatic melanoma  ([id number], [facility]).   -11/28/21; cancer progression to lungs, soft tissues.  -04/12/22; started enco-bini (04/09/22)    SUBJECTIVE  Miasha Fetner is a 89 y.o. female was phoned in to assess her health status while on enco-bini for metastatic melanoma. I confirmed that at the time of the phone visit, she was located at her home in the state of . I obtained her verbal consent to review her medical records. Since last visit she received a week-long of enco-bini; last dose was 3 weeks ago (6/27). She did not report a whole lot of side effects. Her blood pressure remained in range 105-155/6-950. She denied any ha; she continues to be a little dizzy. Despite the hot and humid weather, she goes out to dinner with her grand-daughters, going to church. She denies n, diarrhea, new lumps, chest pain, SOB, vision changes, leg swelling. She has baseline cramping in feet.     MEDICATIONS    amLODIPine (NORVASC) 10 MG tablet, Take 1 tablet (10 mg total) by mouth.    atorvastatin (LIPITOR) 20 MG tablet,     metoprolol tartrate (LOPRESSOR) 25 MG tablet, TAKE 0.5 TABLETS BY MOUTH 2 TIMES  DAILY.    multivitamin,ther and minerals (MULTI-VITAMIN HP/MINERALS ORAL), Take 1 tablet by mouth daily.    olmesartan (BENICAR) 40 MG tablet,     olopatadine (PATANOL) 0.1 % ophthalmic solution,     olmesartan (BENICAR) 40 MG tablet, Take 1 tablet (40 mg total) by mouth daily.    spironolactone (ALDACTONE) 25 MG tablet, Take 1 tablet by mouth daily    OBJECTIVE  This was a phone visit.

## 2022-09-25 NOTE — Progress Notes (Signed)
[facility] Summary Report / Loop Recorder 

## 2022-10-01 NOTE — Unmapped (Signed)
[facility] Refill Coordination Note    Specialty Medication(s) to be Shipped:   Hematology/Oncology: Mektovi 15mg     Other medication(s) to be shipped: No additional medications requested for fill at this time     Taylor Adkins, DOB: Aug 11, 1926  Phone: [phone] (home)       All above HIPAA information was verified with  patient's daughter      Was a translator used for this call? No    Completed refill call assessment today to schedule patient's medication shipment from the [facility] [phone]).  All relevant notes have been reviewed.     Specialty medication(s) and dose(s) confirmed: Regimen is correct and unchanged.   Changes to medications: Taylor Adkins reports no changes at this time.  Changes to insurance: No  New side effects reported not previously addressed with a pharmacist or physician: None reported  Questions for the pharmacist: No    Confirmed patient received a Conservation officer, historic buildings and a Surveyor, mining with first shipment. The patient will receive a drug information handout for each medication shipped and additional FDA Medication Guides as required.       DISEASE/MEDICATION-SPECIFIC INFORMATION        N/A    SPECIALTY MEDICATION ADHERENCE     Medication Adherence    Patient reported X missed doses in the last month: 0  Specialty Medication: MEKTOVI 15 mg tablet (binimetinib)  Patient is on additional specialty medications: No  Informant: child/children              Were doses missed due to medication being on hold? No    Mektovi 15 mg: 8 days of medicine on hand       REFERRAL TO PHARMACIST     Referral to the pharmacist: Not needed      [facility]     Shipping address confirmed in Epic.       Delivery Scheduled: Yes, Expected medication delivery date: 10/16/22.     Medication will be delivered via UPS to the prescription address in Epic WAM.    Jasper Loser   [facility] Specialty Technician

## 2022-10-08 ENCOUNTER — Ambulatory Visit: Payer: Medicare Other

## 2022-10-08 DIAGNOSIS — R55 Syncope and collapse: Secondary | ICD-10-CM

## 2022-10-23 NOTE — Progress Notes (Signed)
[facility] Summary Report / Loop Recorder 

## 2022-10-24 DIAGNOSIS — C4359 Malignant melanoma of other part of trunk: Principal | ICD-10-CM

## 2022-10-24 NOTE — Unmapped (Signed)
I spoke with patient Taylor Adkins to confirm appointments on the following date(s): 11/09/22    Merilyn Baba, CNA

## 2022-10-30 ENCOUNTER — Telehealth: Payer: Self-pay

## 2022-10-30 NOTE — Telephone Encounter (Signed)
Patient reports she was watching tv during this time and denies any symptoms. Patient does not recall any syncope, does live alone. Advised I will forward to Dr. Elberta Fortis for review. Advised to call if any symptoms arise. Voiced understanding.

## 2022-10-30 NOTE — Telephone Encounter (Addendum)
Spoke to patients daughter Olegario Messier, advised pt to STOP metoprolol and call if any symptoms arise in the future Voiced understanding

## 2022-10-30 NOTE — Telephone Encounter (Signed)
Following alert received from [employer] received for 18 second pause 8/26 @ 10:29, 18sec per device, EGM appears to be heart block with enhancement.  Spoke to patients daughter who will call patient to follow up further about pause. Direct dial left and advised to call back. Agreeable to plan.

## 2022-11-09 ENCOUNTER — Ambulatory Visit: Admit: 2022-11-09 | Discharge: 2022-11-10 | Payer: MEDICARE

## 2022-11-09 ENCOUNTER — Other Ambulatory Visit: Admit: 2022-11-09 | Discharge: 2022-11-10 | Payer: MEDICARE

## 2022-11-09 ENCOUNTER — Ambulatory Visit
Admit: 2022-11-09 | Discharge: 2022-11-10 | Payer: MEDICARE | Attending: Hematology & Oncology | Primary: Hematology & Oncology

## 2022-11-09 DIAGNOSIS — C4359 Malignant melanoma of other part of trunk: Principal | ICD-10-CM

## 2022-11-09 LAB — CBC W/ AUTO DIFF
BASOPHILS ABSOLUTE COUNT: 0 10*9/L (ref 0.0–0.1)
BASOPHILS RELATIVE PERCENT: 0.2 %
EOSINOPHILS ABSOLUTE COUNT: 0 10*9/L (ref 0.0–0.5)
EOSINOPHILS RELATIVE PERCENT: 0.4 %
HEMATOCRIT: 33.3 % — ABNORMAL LOW (ref 34.0–44.0)
HEMOGLOBIN: 11.3 g/dL (ref 11.3–14.9)
LYMPHOCYTES ABSOLUTE COUNT: 1.7 10*9/L (ref 1.1–3.6)
LYMPHOCYTES RELATIVE PERCENT: 25.1 %
MEAN CORPUSCULAR HEMOGLOBIN CONC: 34 g/dL (ref 32.0–36.0)
MEAN CORPUSCULAR HEMOGLOBIN: 31.1 pg (ref 25.9–32.4)
MEAN CORPUSCULAR VOLUME: 91.5 fL (ref 77.6–95.7)
MEAN PLATELET VOLUME: 8.4 fL (ref 6.8–10.7)
MONOCYTES ABSOLUTE COUNT: 0.6 10*9/L (ref 0.3–0.8)
MONOCYTES RELATIVE PERCENT: 8.3 %
NEUTROPHILS ABSOLUTE COUNT: 4.5 10*9/L (ref 1.8–7.8)
NEUTROPHILS RELATIVE PERCENT: 66 %
PLATELET COUNT: 228 10*9/L (ref 150–450)
RED BLOOD CELL COUNT: 3.64 10*12/L — ABNORMAL LOW (ref 3.95–5.13)
RED CELL DISTRIBUTION WIDTH: 13.6 % (ref 12.2–15.2)
WBC ADJUSTED: 6.8 10*9/L (ref 3.6–11.2)

## 2022-11-09 LAB — COMPREHENSIVE METABOLIC PANEL
ALBUMIN: 3.5 g/dL (ref 3.4–5.0)
ALKALINE PHOSPHATASE: 45 U/L — ABNORMAL LOW (ref 46–116)
ALT (SGPT): 10 U/L (ref 10–49)
ANION GAP: 5 mmol/L (ref 5–14)
AST (SGOT): 18 U/L (ref <=34)
BILIRUBIN TOTAL: 0.6 mg/dL (ref 0.3–1.2)
BLOOD UREA NITROGEN: 31 mg/dL — ABNORMAL HIGH (ref 9–23)
BUN / CREAT RATIO: 36
CALCIUM: 9.7 mg/dL (ref 8.7–10.4)
CHLORIDE: 104 mmol/L (ref 98–107)
CO2: 28 mmol/L (ref 20.0–31.0)
CREATININE: 0.86 mg/dL
EGFR CKD-EPI (2021) FEMALE: 63 mL/min/{1.73_m2} (ref >=60)
GLUCOSE RANDOM: 87 mg/dL (ref 70–179)
POTASSIUM: 4.6 mmol/L (ref 3.4–4.8)
PROTEIN TOTAL: 6.8 g/dL (ref 5.7–8.2)
SODIUM: 137 mmol/L (ref 135–145)

## 2022-11-09 MED ADMIN — Fluorine F-18 FDG 4-40 mCi IV: 8.62 | INTRAVENOUS | @ 15:00:00 | Stop: 2022-11-09

## 2022-11-09 NOTE — Unmapped (Unsigned)
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 Currently on intermittent enco-bini on a week-on, 3-weeks-off basis. Issues to be discussed:   - Ms. Avino continues to tolerate the current schedule of 9 days on and 4 weeks off for enco-bini, which was increased from the prior schedule of 1 week on and 3 weeks off at the last visit.  - Her home blood pressure remains stable in the 120s/70s despite discontinuation of metoprolol due to a reported sinus pause of 4 seconds; her cardiologist is aware and monitoring this.  - Recent PET/CT imaging showed no evidence of recurrent or metastatic disease.  - Dermatology identified a new superficial lesion on her left upper arm, distinct from the lesion previously noted on the right shoulder, which was discussed as a primary melanoma. We explained that the current treatment with enco-bini aims to control the melanoma that has already metastasized from other sites, not prevent new primary melanomas. The occurrence of a second primary melanoma is generally less concerning than the initial one.  - We emphasized that the development of a new primary melanoma does not indicate a failure of her current treatment regimen; her disease remains stable with no progression since the March scans, suggesting that the disease is frozen in its current state.  - We discussed that our goal is to administer the minimal effective dose of enco-bini for the shortest duration possible to control the cancer while maintaining Ms. Donavan's quality of life.  - We discussed the possibility of experimenting with an extended dosing schedule of 10 days on and 40 days off. While this approach has not been tried previously, it could allow for fewer days on medication over the next 90 days (e.g., 20 days instead of the current 25 days over 90 days) while maintaining disease control. I reassured her that this is a safe option to consider since there is no immediate threat to her life over the next 3 months. If any progression is seen on future scans, we would know to avoid reducing the treatment below 27 days in 90 days. However, Ms. Laduca and her daughter prefer to maintain the current schedule, as she reports a good quality of life with it.  - Ms. Millhouse will continue with the next cycle of 9 days of enco-bini, repeating this every 4 weeks. We plan to see her in 3 months for repeat restaging scans and bloodwork.    I will see her back in 3 months.         Plans to start tomorrow for a week.  To drink more.  Will see you in 2 months.   Diagnosis: Melanoma, cutaneous primary  Other: None  Stage: M1b  Current Therapy: Enco-bini (150-15) q4 weeks      REFERRING PROVIDER  Self-referral    ONCOLOGY HISTORY  -mid summer 2021; changing (raising, itching) L upper back lesion  -10/12/19; melanoma, superficial spreading type, Breslow thickness 3.45mm, no ulceration, no satellitosis, 11 mitoses/mm2, no regression, non-brisk tumor-infiltrating lymphocytes ([id number]).  -11/04/19;  wide excision and sentinel lymph node mapping c/w residual melanoma, superficial spreading type, Breslow thickness 0.2 mm, 0 mitoses per millimeter square, small incidental compound nevus.  Single left axillary sentinel lymph node was removed and was positive for endometrial metastatic foci of melanoma (sizes of 2 mm, 0.8 mm, and 0.1 mm).  -11/30/19;  significant for a 6 mm left thyroid lobe nodule partially calcified and mildly FDG avid.  Mild FDG avidity in the left axilla of the largest left axillary lymph  node measuring 1.4 cm (SUV max of 2.8).  -02/01/20; started adjuvant single-agent nivolumab  -02/19/20; last (second) dose of nivolumab (120mg ); treatment was held due to development of constitutional symptoms.   -07/07/20; punch mid back lesion showed metastatic melanoma ([id number], [facility]).   -08/03/21; palliative resection of the L upper back lump c/w metastatic melanoma  ([id number], [facility]).   -11/28/21; cancer progression to lungs, soft tissues.  -04/12/22; started enco-bini (04/09/22)  - 09/28/22: [facility] biopsied arm and it came back as thin melanoma    SUBJECTIVE  Taylor Adkins is a 89 y.o. female who comes for follow-up while on enco-bini. Since last visit, she had a biopsy performed by Dr. Roderic Scarce at [facility] on 7/26, which identified a new melanoma on her left upper arm. This lesion was excised on 08/05, with pathology confirming a thin melanoma with negative margins. On 08/21, she tested positive for COVID-19, for which no specific treatment was administered. She completed a two-week period of home isolation following the COVID-19 diagnosis.    Today, Shams reports feeling generally well but notes an intermittent feeling of sadness. She denies any fevers, chills, profuse sweating, chest pain, SOB, nausea, vomiting, diarrhea, constipation, or peripheral swelling. She recently completed a 9-day cycle of her cancer medication, which was delayed due to her COVID-19 diagnosis. She has a loop recorder in place for the past four years, which recently detected a pause on 10/29/22 of either 4 or 18 seconds, as reported by the patient. This finding was communicated with [facility], and she was advised to discontinue metoprolol. Her blood pressure has been stable at home, ranging in the 120s/70s, and was 138/52 today in the clinic. She reports feeling somewhat tired, which she attributes to her recent illness with COVID. Other systems were reviewed and were negative.     MEDICATIONS    amLODIPine (NORVASC) 10 MG tablet, Take 1 tablet (10 mg total) by mouth.    atorvastatin (LIPITOR) 20 MG tablet.    binimetinib (MEKTOVI) 15 mg tablet, Take 1 tablet (15 mg total) by mouth two (2) times a day. Take for 1 week on, 3 weeks off.    encorafenib (BRAFTOVI) 75 mg capsule, Take 2 capsules (150 mg total) by mouth daily. Take for 1 week on, 3 weeks off.    metoprolol tartrate (LOPRESSOR) 25 MG tablet, TAKE 0.5 TABLETS BY MOUTH 2 TIMES DAILY.    multivitamin,ther and minerals (MULTI-VITAMIN HP/MINERALS ORAL), Take 1 tablet by mouth daily.    olmesartan (BENICAR) 40 MG tablet, Take 1 tablet (40 mg total) by mouth daily.    olopatadine (PATANOL) 0.1 % ophthalmic solution, Apply to eye.    sertraline (ZOLOFT) 50 MG tablet, Take 1 tablet (50 mg total) by mouth daily.     spironolactone (ALDACTONE) 25 MG tablet, Take 1 tablet (25 mg total) by mouth daily.      OBJECTIVE  GENERAL:  Breahna Boylen appears alert, oriented, and cooperative.  Weight 54.8kg (she was 54.7kg in 05/25/22), Temp 36.8,  BP 138/52, HR 78, RR 16.  Skin with no rash. Long-standing keratotic lesions in sun-exposed areas, no suspicious pigmented lesions that require any excision  No sinus tenderness, mouth is benign, no cervical or supraclavicular lymphadenopathy  Lungs are clear to auscultation, heart is regular rate and rhythm, normal S1/S2, no murmur  BACK well-healed scar on the L flank area without satellite or in-transit disease  Abdomen is soft, nontender, no organomegaly, no inguinal adenopathy  Extremities with no  edema, no calf tenderness. Posterior L arm well healing excision site    RADIOGRAPHIC DATA   PET CT from today (9/6) showed no evidence of recurrent or metastatic disease.         I attest that I, Corinda Gubler, personally documented this note while acting as scribe for [employer], MD.      Corinda Gubler, Scribe.  11/09/2022   ______________________________________________      Documentation assistance provided by Medical Scribe, Corinda Gubler, who was present during the entirety of the visit. I reviewed the note below and validated all of the information provided to ensure accuracy and completeness.     Stergios Moschos, MD    ------------------------------------------------------------

## 2022-11-09 NOTE — Unmapped (Signed)
Patient's daughter declined refills for Braftovi at this time, patient has enough medication on hand to complete next treatment cycle. Next treatment cycle starts on 09/26, will call back three weeks after treatment starts.

## 2022-11-12 ENCOUNTER — Ambulatory Visit: Payer: Medicare Other

## 2022-11-12 DIAGNOSIS — R55 Syncope and collapse: Secondary | ICD-10-CM

## 2022-11-12 DIAGNOSIS — C4359 Malignant melanoma of other part of trunk: Principal | ICD-10-CM

## 2022-11-12 LAB — CUP PACEART REMOTE DEVICE CHECK
Date Time Interrogation Session: 20240906230350
Implantable Pulse Generator Implant Date: 20230613

## 2022-11-29 ENCOUNTER — Encounter: Payer: Self-pay | Admitting: Cardiology

## 2022-11-29 NOTE — Progress Notes (Signed)
[facility] Summary Report / Loop Recorder 

## 2022-12-17 ENCOUNTER — Ambulatory Visit (INDEPENDENT_AMBULATORY_CARE_PROVIDER_SITE_OTHER): Payer: Medicare Other

## 2022-12-17 DIAGNOSIS — R55 Syncope and collapse: Secondary | ICD-10-CM | POA: Diagnosis not present

## 2022-12-17 LAB — CUP PACEART REMOTE DEVICE CHECK
Date Time Interrogation Session: 20241013230338
Implantable Pulse Generator Implant Date: 20230613

## 2022-12-20 NOTE — Unmapped (Signed)
Patient's daughter declined refills for Braftovi at this time, she has enough medication on hand for next treatment cycle. Patient's daughter is requesting a call back in three weeks.

## 2023-01-03 NOTE — Progress Notes (Signed)
[facility] Summary Report / Loop Recorder 

## 2023-01-11 NOTE — Unmapped (Signed)
[facility] Refill Coordination Note    Specialty Medication(s) to be Shipped:   Hematology/Oncology: Taylor Adkins and Taylor Adkins    Other medication(s) to be shipped: No additional medications requested for fill at this time     Taylor Adkins, DOB: 11-30-1926  Phone: [phone] (home)       All above HIPAA information was verified with patient's family member, Taylor Adkins.     Was a Nurse, learning disability used for this call? No    Completed refill call assessment today to schedule patient's medication shipment from the [facility]  [phone]).  All relevant notes have been reviewed.     Specialty medication(s) and dose(s) confirmed: Regimen is correct and unchanged.   Changes to medications: Taylor Adkins reports no changes at this time.  Changes to insurance: No  New side effects reported not previously addressed with a pharmacist or physician: None reported  Questions for the pharmacist: No    Confirmed patient received a Conservation officer, historic buildings and a Surveyor, mining with first shipment. The patient will receive a drug information handout for each medication shipped and additional FDA Medication Guides as required.       DISEASE/MEDICATION-SPECIFIC INFORMATION        N/A    SPECIALTY MEDICATION ADHERENCE     Medication Adherence    Patient reported X missed doses in the last month: 0  Specialty Medication: Taylor Adkins 15 mg tablet (binimetinib)  Patient is on additional specialty medications: Yes  Additional Specialty Medications: BRAFTOVI 75 mg capsule (encorafenib)  Patient Reported Additional Medication X Missed Doses in the Last Month: 0  Patient is on more than two specialty medications: No  Any gaps in refill history greater than 2 weeks in the last 3 months: no  Demonstrates understanding of importance of adherence: yes  Informant: child/children              Were doses missed due to medication being on hold? No    Taylor Adkins 15   mg: 7 days of medicine on hand   BRAFTOVI 75 mg: 0 days of medicine on hand       REFERRAL TO PHARMACIST     Referral to the pharmacist: Not needed      [facility]     Shipping address confirmed in Epic.       Delivery Scheduled: Yes, Expected medication delivery date: 01/16/23.     Medication will be delivered via UPS to the prescription address in Epic WAM.    Taylor Adkins   [facility]  Specialty Technician

## 2023-01-14 NOTE — Unmapped (Signed)
[facility] has contacted the Communication Center requesting that the following paperwork be completed:     Type Visit Notes 11/09/22  Contact Name Glenna Fellows Number [phone]  Contact Fax [phone]  Date needed back by as soon as possible    Caller has been informed that the expected turnaround is 7-10 business days.    Thank you,   Sharl Ma  [facility]   [phone]    [facility]

## 2023-01-15 MED FILL — MEKTOVI 15 MG TABLET: ORAL | 90 days supply | Qty: 60 | Fill #1

## 2023-01-15 MED FILL — BRAFTOVI 75 MG CAPSULE: ORAL | 90 days supply | Qty: 90 | Fill #1

## 2023-01-15 NOTE — Unmapped (Signed)
Faxed office notes per request. Received fax confirmation.     Lovie Macadamia, RN

## 2023-01-21 ENCOUNTER — Ambulatory Visit (INDEPENDENT_AMBULATORY_CARE_PROVIDER_SITE_OTHER): Payer: Medicare Other

## 2023-01-21 DIAGNOSIS — R55 Syncope and collapse: Secondary | ICD-10-CM

## 2023-01-21 LAB — CUP PACEART REMOTE DEVICE CHECK
Date Time Interrogation Session: 20241117232438
Implantable Pulse Generator Implant Date: 20230613

## 2023-02-15 ENCOUNTER — Ambulatory Visit
Admit: 2023-02-15 | Discharge: 2023-02-16 | Payer: MEDICARE | Attending: Hematology & Oncology | Primary: Hematology & Oncology

## 2023-02-15 ENCOUNTER — Ambulatory Visit: Admit: 2023-02-15 | Discharge: 2023-02-16 | Payer: MEDICARE

## 2023-02-15 DIAGNOSIS — C439 Malignant melanoma of skin, unspecified: Principal | ICD-10-CM

## 2023-02-15 DIAGNOSIS — C4359 Malignant melanoma of other part of trunk: Principal | ICD-10-CM

## 2023-02-15 MED ADMIN — Fluorine F-18 FDG 4-40 mCi IV: 5 | INTRAVENOUS | @ 18:00:00 | Stop: 2023-02-15

## 2023-02-15 NOTE — Unmapped (Unsigned)
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 Currently on intermittent enco-bini on a week-on, 3-weeks-off basis. Issues to be discussed:   -We reviewed her PET CT today, which reveals no evidence of metastatic disease. With regard to the FDG avidity of her sigmoid colon, I recommended a flexible sigmoidoscopy that would visualized the sigmoid colon. I will place the order for it.    -She appears to be tolerating the encorafenib-binimetinib well at the dose and frequency she is currently on and it is controlling her disease. She should remain on it for now. At this point, I am comfortable with increasing the interval between visits to every 4 months.     I will see her back in 4 months with repeat PET CT.       Diagnosis: Melanoma, cutaneous primary  Other: None  Stage: M1b  Current Therapy: Enco-bini (150-15) q4 weeks      REFERRING PROVIDER  Self-referral    ONCOLOGY HISTORY  -mid summer 2021; changing (raising, itching) L upper back lesion  -10/12/19; melanoma, superficial spreading type, Breslow thickness 3.101mm, no ulceration, no satellitosis, 11 mitoses/mm2, no regression, non-brisk tumor-infiltrating lymphocytes ([id number]).  -11/04/19;  wide excision and sentinel lymph node mapping c/w residual melanoma, superficial spreading type, Breslow thickness 0.2 mm, 0 mitoses per millimeter square, small incidental compound nevus.  Single left axillary sentinel lymph node was removed and was positive for endometrial metastatic foci of melanoma (sizes of 2 mm, 0.8 mm, and 0.1 mm).  -11/30/19;  significant for a 6 mm left thyroid lobe nodule partially calcified and mildly FDG avid.  Mild FDG avidity in the left axilla of the largest left axillary lymph node measuring 1.4 cm (SUV max of 2.8).  -02/01/20; started adjuvant single-agent nivolumab  -02/19/20; last (second) dose of nivolumab (120mg ); treatment was held due to development of constitutional symptoms.   -07/07/20; punch mid back lesion showed metastatic melanoma ([id number], [facility]).   -08/03/21; palliative resection of the L upper back lump c/w metastatic melanoma  ([id number], [facility]).   -11/28/21; cancer progression to lungs, soft tissues.  -04/12/22; started enco-bini (04/09/22)  - 09/28/22: [facility] biopsied arm and it came back as thin melanoma    SUBJECTIVE  Taylor Adkins is a 89 y.o. female who comes to review restaging scans while on intermittent dose of enco-bini.  She reports today that she is never hungry but still eats three times a day (mostly leftovers). She takes 2 pills of the encorafenib at night, and 1 pill binimetinib in the morning and 1 pill binimetinib at night, with 1 week on, 3 weeks off. She notes that she has a lot of fatigue and that her shoulders ache, but attributes this to lifting. She continues to walk about one mile almost daily by herself, and always walks with her medical alert bracelet. She notes that she monitors her blood pressure at home, but has noticed that it is sometimes low but stable. She has an occasional cough. She continues to live in an apartment with two floors and lives on the second floor. She reports that she is a bit depressed because of having cancer, but her daughter has noticed that she has been more cheerful lately.     Denies vision changes, SOB. Other systems were reviewed and were negative.       MEDICATIONS  Amlodipine 10 mg, atorvastatin 20 mg, binimetinib 15 mg bid (1 week on, 3 weeks off), encorafenib 150 mg daily (1 week on, 3 weeks off), multivitamin  daily, olmesartan 40 mg daily, olopatadine 0.1% ophthalmic solution, sertraline 50 mg daily, spironolactone 25 mg daily      OBJECTIVE  GENERAL:  Taylor Adkins appears alert, oriented, and cooperative.  Weight 55.3 kg, Temp 36.7,  BP 142/64, HR 84, RR 18 O2 98% ECOG 1  Skin with no rash. Long-standing keratotic lesions in sun-exposed areas, no suspicious pigmented lesions that require any excision  No sinus tenderness, mouth is benign, no cervical or supraclavicular lymphadenopathy  Lungs are clear to auscultation, heart is regular rate and rhythm, normal S1/S2, no murmur  BACK well-healed scar on the L flank area without satellite or in-transit disease  Abdomen is soft, nontender, no organomegaly, no inguinal adenopathy  Extremities with no edema, no calf tenderness. Posterior L arm well healing excision site    RADIOGRAPHIC DATA  PET CT today w/ new focal FDG avidity in the sigmoid colon, multi nodular thyroid including a 1.6 cm hypodense right thyroid nodule, FDG avid opacification of the inferior ethmoid air cells and FDG avid mucosal thickening of the right greater than left nasal turbinate right greater than left, and stable non-FDG-avid 0.7 cm right upper lobe nodule and 0.7 cm right lower lobe solid pulmonary nodule. No evidence of locally recurrent or metastatic disease.   _________________________________________________________________________________  Documentation assistance was provided by Rae Lips, Scribe, on February 15, 2023 at 3:30 PM for Ansel Bong, MD

## 2023-02-15 NOTE — Progress Notes (Signed)
[facility] Summary Report / Loop Recorder 

## 2023-02-18 DIAGNOSIS — C4359 Malignant melanoma of other part of trunk: Principal | ICD-10-CM

## 2023-02-18 DIAGNOSIS — C439 Malignant melanoma of skin, unspecified: Principal | ICD-10-CM

## 2023-02-18 NOTE — Unmapped (Signed)
Sigmoidoscopy  Procedure #1     Procedure #2   [id number]  MRN     Endoscopist   TRUE  Is the patient's health insurance [employer], [employer], [place] [facility]), [employer] [place], [employer], or [place]?     Urgent procedure     Are you pregnant?     Are you in the process of scheduling or awaiting results of a heart ultrasound, stress test, or catheterization to evaluate new or worsening chest pain, dizziness, or shortness of breath?     Do you take: Plavix (clopidogrel), Coumadin (warfarin), Lovenox (enoxaparin), Pradaxa (dabigatran), Effient (prasugrel), Xarelto (rivaroxaban), Eliquis (apixaban), Pletal (cilostazol), or Brilinta (ticagrelor)?          Did ordering provider indicate how long to hold this medication in the order comments?          Which of the above medications are you taking?          What is the name of the medical practice that manages this medication?          What is the name of the medical provider who manages this medication?     Do you have hemophilia, von Willebrand disease, or low platelets?     Do you have a pacemaker or implanted cardiac defibrillator?     Has a [place] [facility] provider specified the location(s)?     Which location(s) did the [facility] provider specify?          [facility]          [place]          [facility]     Do you see a liver specialist for chronic liver disease?     Is the procedure indication for variceal screening?     Is procedure indication for variceal banding (this does NOT include variceal screening)?     Have you had a heart attack, stroke or heart stent placement within the past 6 months?     Month of event     Year of event (ONLY ENTER LAST 2 DIGITS)        5  Height (feet)   2  Height (inches)   122  Weight (pounds)   22.3  BMI          Did the ordering provider specify a bowel prep?          What bowel prep was specified?     Do you have an ostomy (bag on your stomach that collects your stool)?          Is it an ileostomy?          Is it a colostomy?          Patient doesn't know.     Do you have chronic kidney disease?     Do you have chronic constipation or have you had poor quality bowel preps for past colonoscopies?     Do you have Crohn's disease or ulcerative colitis?     Have you had weight loss surgery?          When you walk around your house or grocery store, do you have to stop and rest due to shortness of breath, chest pain, or light-headedness?     Do you ever use supplemental oxygen?     Have you been hospitalized for cirrhosis of the liver or heart failure in the last 12 months?     Have you been treated for mouth or throat  cancer with radiation or surgery?     Have you been told that it is difficult for doctors to insert a breathing tube in you during anesthesia?     Have you had a heart or lung transplant?          Are you on dialysis?     Do you have cirrhosis of the liver?     Do you have myasthenia gravis?     Is the patient a prisoner?   ################# ## ###################################################################################################################   MRN:  [id number]   Anticoag Review  No   Nurse Triage  No   GI clinic consult  No   Procedure(s):  Sigmoidoscopy     0   Endoscopist:  0   Urgent:  No   Prep:  Miralax Prep                  --------------------------- --- ----------------------------------------------------------------------------------------------------------------------------------------------------------------------------   [facility] Locations:  [facility]   [facility]  [facility]     [place]        Requested Locations:              ################# ## ###################################################################################################################

## 2023-02-25 ENCOUNTER — Ambulatory Visit (INDEPENDENT_AMBULATORY_CARE_PROVIDER_SITE_OTHER): Payer: Medicare Other

## 2023-02-25 DIAGNOSIS — R55 Syncope and collapse: Secondary | ICD-10-CM | POA: Diagnosis not present

## 2023-02-26 ENCOUNTER — Encounter: Payer: Self-pay | Admitting: Cardiology

## 2023-02-26 LAB — CUP PACEART REMOTE DEVICE CHECK
Date Time Interrogation Session: 20241222231629
Implantable Pulse Generator Implant Date: 20230613

## 2023-03-26 DIAGNOSIS — C4359 Malignant melanoma of other part of trunk: Principal | ICD-10-CM

## 2023-04-01 ENCOUNTER — Ambulatory Visit (INDEPENDENT_AMBULATORY_CARE_PROVIDER_SITE_OTHER): Payer: Medicare Other

## 2023-04-01 DIAGNOSIS — R55 Syncope and collapse: Secondary | ICD-10-CM

## 2023-04-01 LAB — CUP PACEART REMOTE DEVICE CHECK
Date Time Interrogation Session: 20250126231736
Implantable Pulse Generator Implant Date: 20230613

## 2023-04-04 DIAGNOSIS — C4359 Malignant melanoma of other part of trunk: Principal | ICD-10-CM

## 2023-04-04 NOTE — Unmapped (Signed)
Spoke to patient's daughter - cathy . She asked to move appts to march 28th - we rescheduled appts from April 15th to march 28th with Dr Harold Hedge and Pet scan.

## 2023-04-08 NOTE — Progress Notes (Signed)
 [facility] Summary Report / Loop Recorder

## 2023-04-08 NOTE — Addendum Note (Signed)
Addended by: Geralyn Flash D on: 04/08/2023 11:41 AM   Modules accepted: Orders

## 2023-04-11 NOTE — Unmapped (Signed)
[facility] Specialty Medication Onboarding    Specialty Medication: BRAFTOVI 75 mg capsule (encorafenib)  Prior Authorization: Approved   Financial Assistance: No - patient declined financial assistance per MAPs referral  Final Copay/Day Supply: $130.00 / 45    Insurance Restrictions: None     Notes to Pharmacist: patient decline financial assistance- Financial planner Card on File: no  Start Date on Rx:  08/20/22    The triage team has completed the benefits investigation and has determined that the patient is able to fill this medication at [facility]. Please contact the patient to complete the onboarding or follow up with the prescribing physician as needed.

## 2023-04-11 NOTE — Unmapped (Signed)
[facility] Specialty Medication Onboarding    Specialty Medication: MEKTOVI 15 mg tablet (binimetinib)  Prior Authorization: Approved   Financial Assistance: No - patient declined financial assistance per MAPs referral  Final Copay/Day Supply: $615.79 / 30 days    Insurance Restrictions: None     Notes to Pharmacist: patient declined financial assistance  Credit Card on File: no  Start Date on Rx:  08/20/22    The triage team has completed the benefits investigation and has determined that the patient is able to fill this medication at [facility]. Please contact the patient to complete the onboarding or follow up with the prescribing physician as needed.

## 2023-04-12 ENCOUNTER — Inpatient Hospital Stay: Admit: 2023-04-12 | Discharge: 2023-04-12 | Payer: MEDICARE

## 2023-04-12 ENCOUNTER — Encounter: Admit: 2023-04-12 | Discharge: 2023-04-12 | Payer: MEDICARE

## 2023-04-12 DIAGNOSIS — C4359 Malignant melanoma of other part of trunk: Principal | ICD-10-CM

## 2023-04-12 MED ADMIN — Propofol (DIPRIVAN) injection: INTRAVENOUS | @ 17:00:00 | Stop: 2023-04-12

## 2023-04-12 MED ADMIN — propofol (DIPRIVAN) infusion 10 mg/mL: INTRAVENOUS | @ 16:00:00 | Stop: 2023-04-12

## 2023-04-12 MED ADMIN — lidocaine (PF) (XYLOCAINE-MPF) 20 mg/mL (2 %) injection: INTRAVENOUS | @ 16:00:00 | Stop: 2023-04-12

## 2023-04-12 MED ADMIN — phenylephrine 1 mg/10 mL (100 mcg/mL) injection Syrg: INTRAVENOUS | @ 17:00:00 | Stop: 2023-04-12

## 2023-04-12 MED ADMIN — lactated Ringers infusion: INTRAVENOUS | @ 16:00:00 | Stop: 2023-04-12

## 2023-04-12 MED ADMIN — Propofol (DIPRIVAN) injection: INTRAVENOUS | @ 16:00:00 | Stop: 2023-04-12

## 2023-04-16 DIAGNOSIS — C4359 Malignant melanoma of other part of trunk: Principal | ICD-10-CM

## 2023-04-17 DIAGNOSIS — C4359 Malignant melanoma of other part of trunk: Principal | ICD-10-CM

## 2023-05-02 NOTE — Unmapped (Signed)
 Taylor Adkins has been contacted in regards to their refill of Braftovi/Mektovi. At this time, they have declined refill due to patient having 1 doses remaining (follow up after 05/31/23 appt). Refill assessment call date has been updated per the patient's request.

## 2023-05-06 ENCOUNTER — Ambulatory Visit (INDEPENDENT_AMBULATORY_CARE_PROVIDER_SITE_OTHER): Payer: Medicare Other

## 2023-05-06 DIAGNOSIS — R55 Syncope and collapse: Secondary | ICD-10-CM

## 2023-05-08 LAB — CUP PACEART REMOTE DEVICE CHECK
Date Time Interrogation Session: 20250302231135
Implantable Pulse Generator Implant Date: 20230613

## 2023-05-13 NOTE — Progress Notes (Signed)
 [facility] Summary Report / Loop Recorder

## 2023-05-31 ENCOUNTER — Inpatient Hospital Stay: Admit: 2023-05-31 | Discharge: 2023-05-31

## 2023-05-31 ENCOUNTER — Ambulatory Visit: Admit: 2023-05-31 | Discharge: 2023-05-31

## 2023-05-31 ENCOUNTER — Ambulatory Visit: Admit: 2023-05-31 | Discharge: 2023-05-31 | Attending: Hematology & Oncology | Primary: Hematology & Oncology

## 2023-05-31 DIAGNOSIS — C4359 Malignant melanoma of other part of trunk: Principal | ICD-10-CM

## 2023-05-31 DIAGNOSIS — C439 Malignant melanoma of skin, unspecified: Principal | ICD-10-CM

## 2023-05-31 DIAGNOSIS — E041 Nontoxic single thyroid nodule: Principal | ICD-10-CM

## 2023-05-31 LAB — COMPREHENSIVE METABOLIC PANEL
ALBUMIN: 3.8 g/dL (ref 3.4–5.0)
ALKALINE PHOSPHATASE: 43 U/L — ABNORMAL LOW (ref 46–116)
ALT (SGPT): 11 U/L (ref 10–49)
ANION GAP: 9 mmol/L (ref 5–14)
AST (SGOT): 23 U/L (ref <=34)
BILIRUBIN TOTAL: 0.5 mg/dL (ref 0.3–1.2)
BLOOD UREA NITROGEN: 27 mg/dL — ABNORMAL HIGH (ref 9–23)
BUN / CREAT RATIO: 32
CALCIUM: 10.5 mg/dL — ABNORMAL HIGH (ref 8.7–10.4)
CHLORIDE: 108 mmol/L — ABNORMAL HIGH (ref 98–107)
CO2: 27 mmol/L (ref 20.0–31.0)
CREATININE: 0.84 mg/dL (ref 0.55–1.02)
EGFR CKD-EPI (2021) FEMALE: 64 mL/min/1.73m2 (ref >=60)
GLUCOSE RANDOM: 101 mg/dL (ref 70–179)
POTASSIUM: 4.8 mmol/L (ref 3.4–4.8)
PROTEIN TOTAL: 7.3 g/dL (ref 5.7–8.2)
SODIUM: 144 mmol/L (ref 135–145)

## 2023-05-31 LAB — CBC W/ AUTO DIFF
BASOPHILS ABSOLUTE COUNT: 0.1 10*9/L (ref 0.0–0.1)
BASOPHILS RELATIVE PERCENT: 1.5 %
EOSINOPHILS ABSOLUTE COUNT: 0 10*9/L (ref 0.0–0.5)
EOSINOPHILS RELATIVE PERCENT: 0.4 %
HEMATOCRIT: 35.3 % (ref 34.0–44.0)
HEMOGLOBIN: 12.2 g/dL (ref 11.3–14.9)
LYMPHOCYTES ABSOLUTE COUNT: 1.6 10*9/L (ref 1.1–3.6)
LYMPHOCYTES RELATIVE PERCENT: 34.7 %
MEAN CORPUSCULAR HEMOGLOBIN CONC: 34.7 g/dL (ref 32.0–36.0)
MEAN CORPUSCULAR HEMOGLOBIN: 31.8 pg (ref 25.9–32.4)
MEAN CORPUSCULAR VOLUME: 91.8 fL (ref 77.6–95.7)
MEAN PLATELET VOLUME: 9.2 fL (ref 6.8–10.7)
MONOCYTES ABSOLUTE COUNT: 0.5 10*9/L (ref 0.3–0.8)
MONOCYTES RELATIVE PERCENT: 10.3 %
NEUTROPHILS ABSOLUTE COUNT: 2.5 10*9/L (ref 1.8–7.8)
NEUTROPHILS RELATIVE PERCENT: 53.1 %
PLATELET COUNT: 202 10*9/L (ref 150–450)
RED BLOOD CELL COUNT: 3.85 10*12/L — ABNORMAL LOW (ref 3.95–5.13)
RED CELL DISTRIBUTION WIDTH: 14.2 % (ref 12.2–15.2)
WBC ADJUSTED: 4.7 10*9/L (ref 3.6–11.2)

## 2023-05-31 MED ADMIN — Fluorine F-18 FDG 4-40 mCi IV: 8.1 | INTRAVENOUS | @ 14:00:00 | Stop: 2023-05-31

## 2023-06-03 DIAGNOSIS — C4359 Malignant melanoma of other part of trunk: Principal | ICD-10-CM

## 2023-06-03 NOTE — Unmapped (Signed)
 [facility] Specialty and Home Delivery Pharmacy Clinical Assessment & Refill Coordination Note    Taylor Adkins, DOB: 1926-05-29  Phone: [phone] (home)     All above HIPAA information was verified with patient.     Was a Nurse, learning disability used for this call? No    Specialty Medication(s):   Hematology/Oncology: Taylor Adkins     Current Outpatient Medications   Medication Sig Dispense Refill    amLODIPine (NORVASC) 10 MG tablet Take 1 tablet (10 mg total) by mouth.      atorvastatin (LIPITOR) 20 MG tablet  (Patient not taking: Reported on 04/12/2023)      binimetinib (MEKTOVI) 15 mg tablet Take 1 tablet (15 mg total) by mouth two (2) times a day. Take for 1 week on, 3 weeks off. 60 tablet 11    encorafenib (BRAFTOVI) 75 mg capsule Take 2 capsules (150 mg total) by mouth daily. Take for 1 week on, 3 weeks off. 60 capsule 11    hydrALAZINE (APRESOLINE) 10 MG tablet Take 1 tablet (10 mg total) by mouth.      multivitamin,ther and minerals (MULTI-VITAMIN HP/MINERALS ORAL) Take 1 tablet by mouth daily.      olmesartan (BENICAR) 40 MG tablet Take 1 tablet (40 mg total) by mouth daily.      olopatadine (PATANOL) 0.1 % ophthalmic solution Apply to eye.      sertraline (ZOLOFT) 50 MG tablet Take 1 tablet (50 mg total) by mouth daily.      spironolactone (ALDACTONE) 25 MG tablet Take 1 tablet (25 mg total) by mouth daily. 90 tablet 3     No current facility-administered medications for this visit.        Changes to medications: [name] reports no changes at this time.    Medication list has been reviewed and updated in Epic: Yes    No Known Allergies    Changes to allergies: No    Allergies have been reviewed and updated in Epic: Yes    SPECIALTY MEDICATION ADHERENCE     Braftovi 75 mg: 7 days of medicine on hand   Mektovi 15 mg: 0 days of medicine on hand     Next cycle starts 06/17/23    Medication Adherence    Patient reported X missed doses in the last month: 0  Specialty Medication: Braftovi 75mg   Patient is on additional specialty medications: Yes  Additional Specialty Medications: Mektovi 15mg   Patient Reported Additional Medication X Missed Doses in the Last Month: 0          Specialty medication(s) dose(s) confirmed: Regimen is correct and unchanged.     Are there any concerns with adherence? No    Adherence counseling provided? Not needed    CLINICAL MANAGEMENT AND INTERVENTION      Clinical Benefit Assessment:    Do you feel the medicine is effective or helping your condition? Yes    Clinical Benefit counseling provided? Not needed    Adverse Effects Assessment:    Are you experiencing any side effects? No    Are you experiencing difficulty administering your medicine? No    Quality of Life Assessment:    Quality of Life      Oncology  1. What impact has your specialty medication had on the reduction of your daily pain or discomfort level?: None  2. On a scale of 1-10, how would you rate your ability to manage side effects associated with your specialty medication? (1=no issues, 10 = unable to take medication  due to side effects): 1            How many days over the past month did your condition/medication  keep you from your normal activities? For example, brushing your teeth or getting up in the morning. 0    Have you discussed this with your provider? Not needed    Acute Infection Status:    Acute infections noted within Epic:  No active infections    Patient reported infection: None    Therapy Appropriateness:    Is therapy appropriate based on current medication list, adverse reactions, adherence, clinical benefit and progress toward achieving therapeutic goals? Yes, therapy is appropriate and should be continued     Clinical Intervention:    Was an intervention completed as part of this clinical assessment? No    DISEASE/MEDICATION-SPECIFIC INFORMATION      N/A    Oncology: Is the patient receiving adequate infection prevention treatment? Not applicable  Does the patient have adequate nutritional support? Not applicable    PATIENT SPECIFIC NEEDS     Does the patient have any physical, cognitive, or cultural barriers? No    Is the patient high risk? Yes, patient is taking oral chemotherapy. Appropriateness of therapy as been assessed    Does the patient require physician intervention or other additional services (i.e., nutrition, smoking cessation, social work)? No    Does the patient have an additional or emergency contact listed in their chart? Yes    SOCIAL DETERMINANTS OF HEALTH     At the [facility], we have learned that life circumstances - like trouble affording food, housing, utilities, or transportation can affect the health of many of our patients.   That is why we wanted to ask: are you currently experiencing any life circumstances that are negatively impacting your health and/or quality of life? No    Social Drivers of Health     Food Insecurity: Low Risk  (11/28/2022)    Received from [facility]    Hunger Vital Sign     Worried About Running Out of Food in the Last Year: Never true     Ran Out of Food in the Last Year: Never true   Tobacco Use: Low Risk  (04/12/2023)    Patient History     Smoking Tobacco Use: Never     Smokeless Tobacco Use: Never     Passive Exposure: Not on file   Transportation Needs: No Transportation Needs (11/28/2022)    Received from [facility]    Transportation     In the past 12 months, has lack of reliable transportation kept you from medical appointments, meetings, work or from getting things needed for daily living? : No   Alcohol Use: Not on file   Housing: Low Risk  (11/28/2022)    Received from [facility]    Housing Stability Vital Sign     What is your living situation today?: I have a steady place to live     Think about the place you live. Do you have problems with any of the following? Choose all that apply:: None/None on this list   Physical Activity: Not on file   Utilities: Low Risk  (11/28/2022)    Received from [facility]    Utilities     In the past 12 months has the electric, gas, oil, or water company threatened to shut off services in your home? : No   Stress: Not on file   Interpersonal Safety: Not  on file   Substance Use: Not on file (01/08/2023)   Intimate Partner Violence: Not on file   Social Connections: Not on file   Financial Resource Strain: Not on file   Depression: Not at risk (11/28/2022)    Received from [facility]    PHQ-2     Patient Health Questionnaire-2 Score: 0   Internet Connectivity: Not on file   Health Literacy: Not on file       Would you be willing to receive help with any of the needs that you have identified today? Not applicable       SHIPPING     Specialty Medication(s) to be Shipped:   Hematology/Oncology: Mektovi    Other medication(s) to be shipped: No additional medications requested for fill at this time     Changes to insurance: No    Cost and Payment: Patient has a copay of $728.31. They are aware and have authorized the pharmacy to charge the credit card on file.    Delivery Scheduled: Yes, Expected medication delivery date: 06/07/23.     Medication will be delivered via UPS to the confirmed prescription address in [facility].    The patient will receive a drug information handout for each medication shipped and additional FDA Medication Guides as required.  Verified that patient has previously received a Conservation officer, historic buildings and a Surveyor, mining.    The patient or caregiver noted above participated in the development of this care plan and knows that they can request review of or adjustments to the care plan at any time.      All of the patient's questions and concerns have been addressed.    Rollen Sox, [facility]   [facility] Specialty Pharmacist

## 2023-06-04 DIAGNOSIS — C439 Malignant melanoma of skin, unspecified: Principal | ICD-10-CM

## 2023-06-04 DIAGNOSIS — C4359 Malignant melanoma of other part of trunk: Principal | ICD-10-CM

## 2023-06-05 DIAGNOSIS — C4359 Malignant melanoma of other part of trunk: Principal | ICD-10-CM

## 2023-06-05 NOTE — Unmapped (Signed)
 Spoke to pts daighter, advised we scheduled [place] and BIOPSY for 4/9th 10AM - she advised that she will need to call us back as she is just getting to a location and doesn't have a calendar.

## 2023-06-06 MED FILL — MEKTOVI 15 MG TABLET: ORAL | 90 days supply | Qty: 60 | Fill #2

## 2023-06-08 IMAGING — CR DG CHEST 2V
2 series · 2 of 2 positions shown · non-contrast
Comparison: 01/06/2021

CLINICAL DATA: Syncope

EXAM:
CHEST - 2 VIEW

[chest pa]
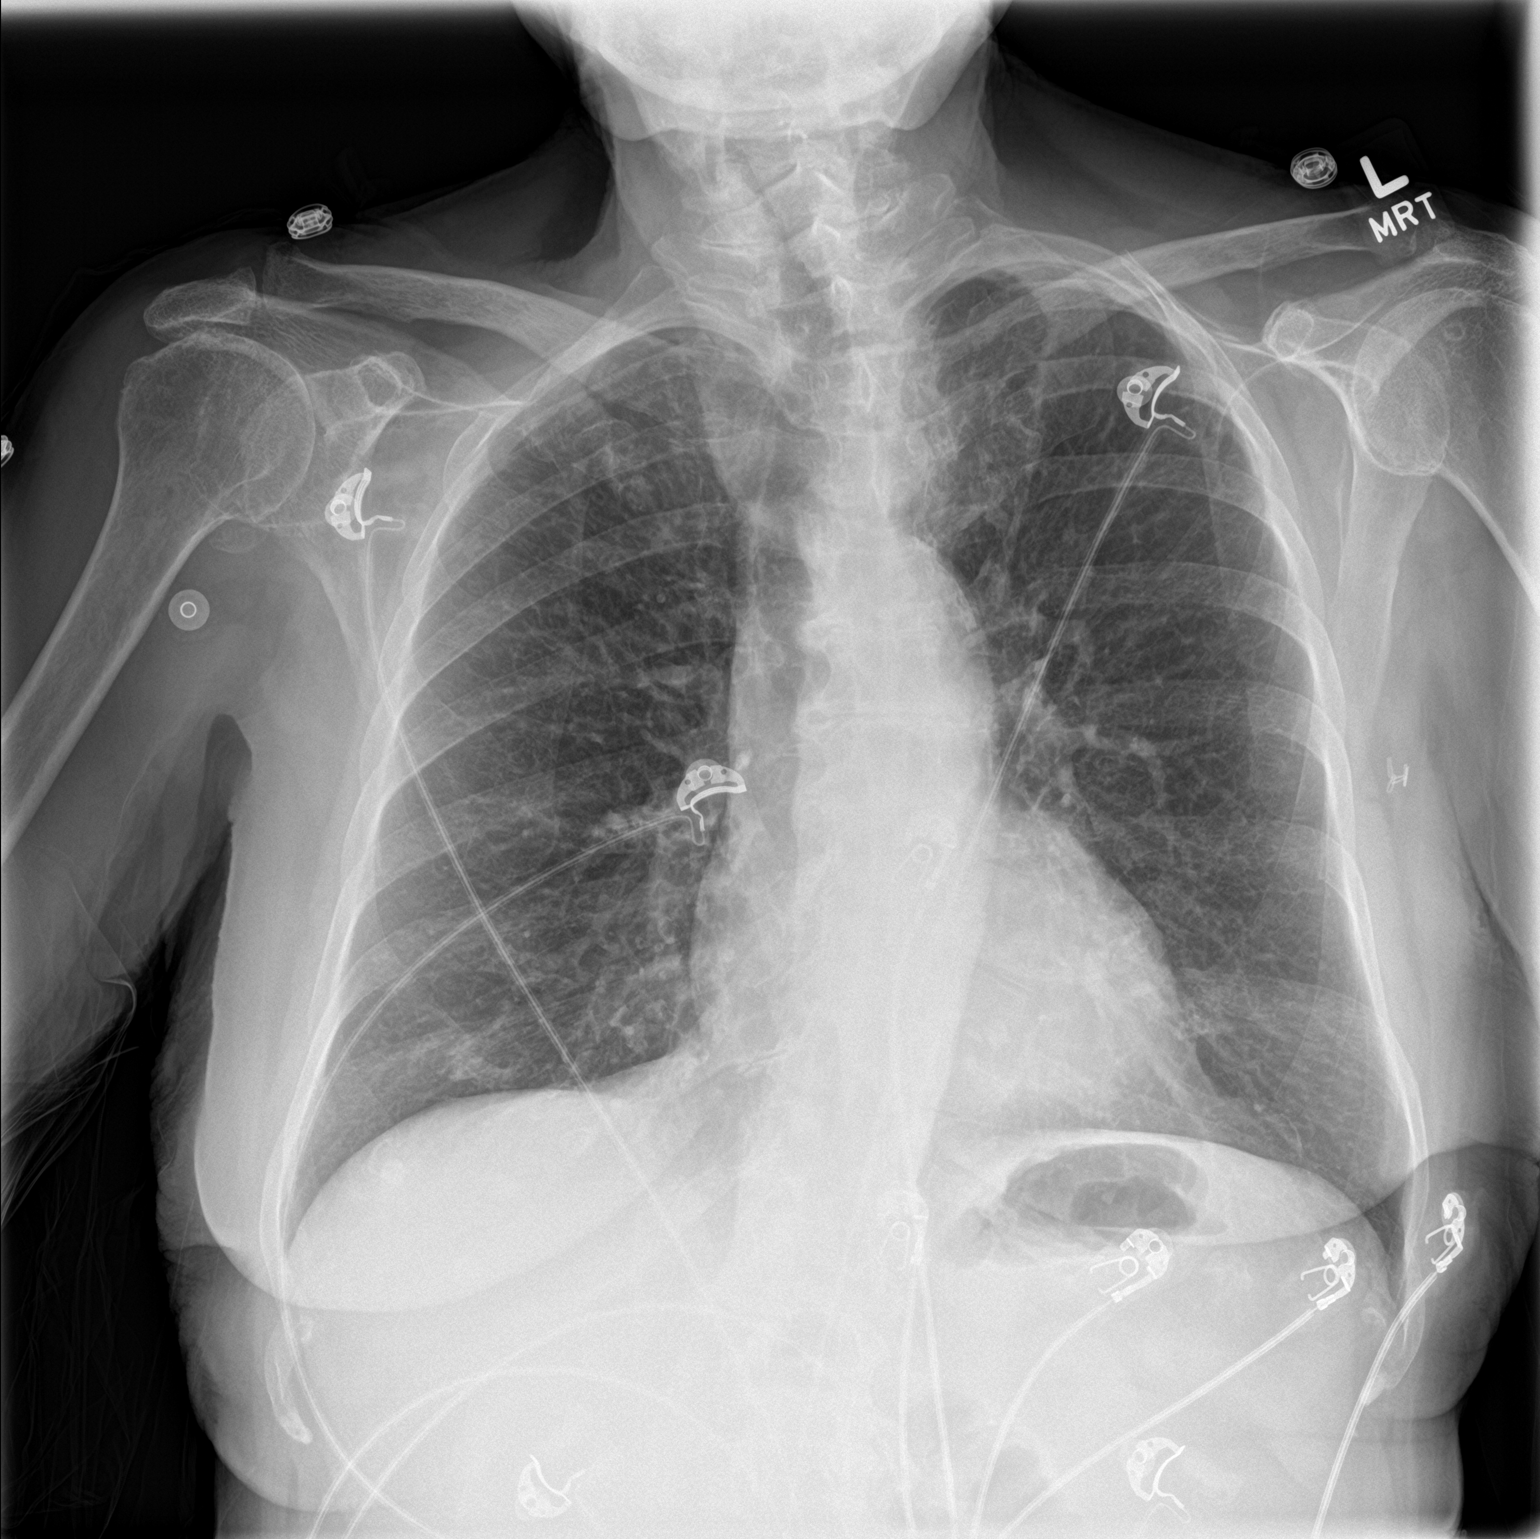

[chest lat]
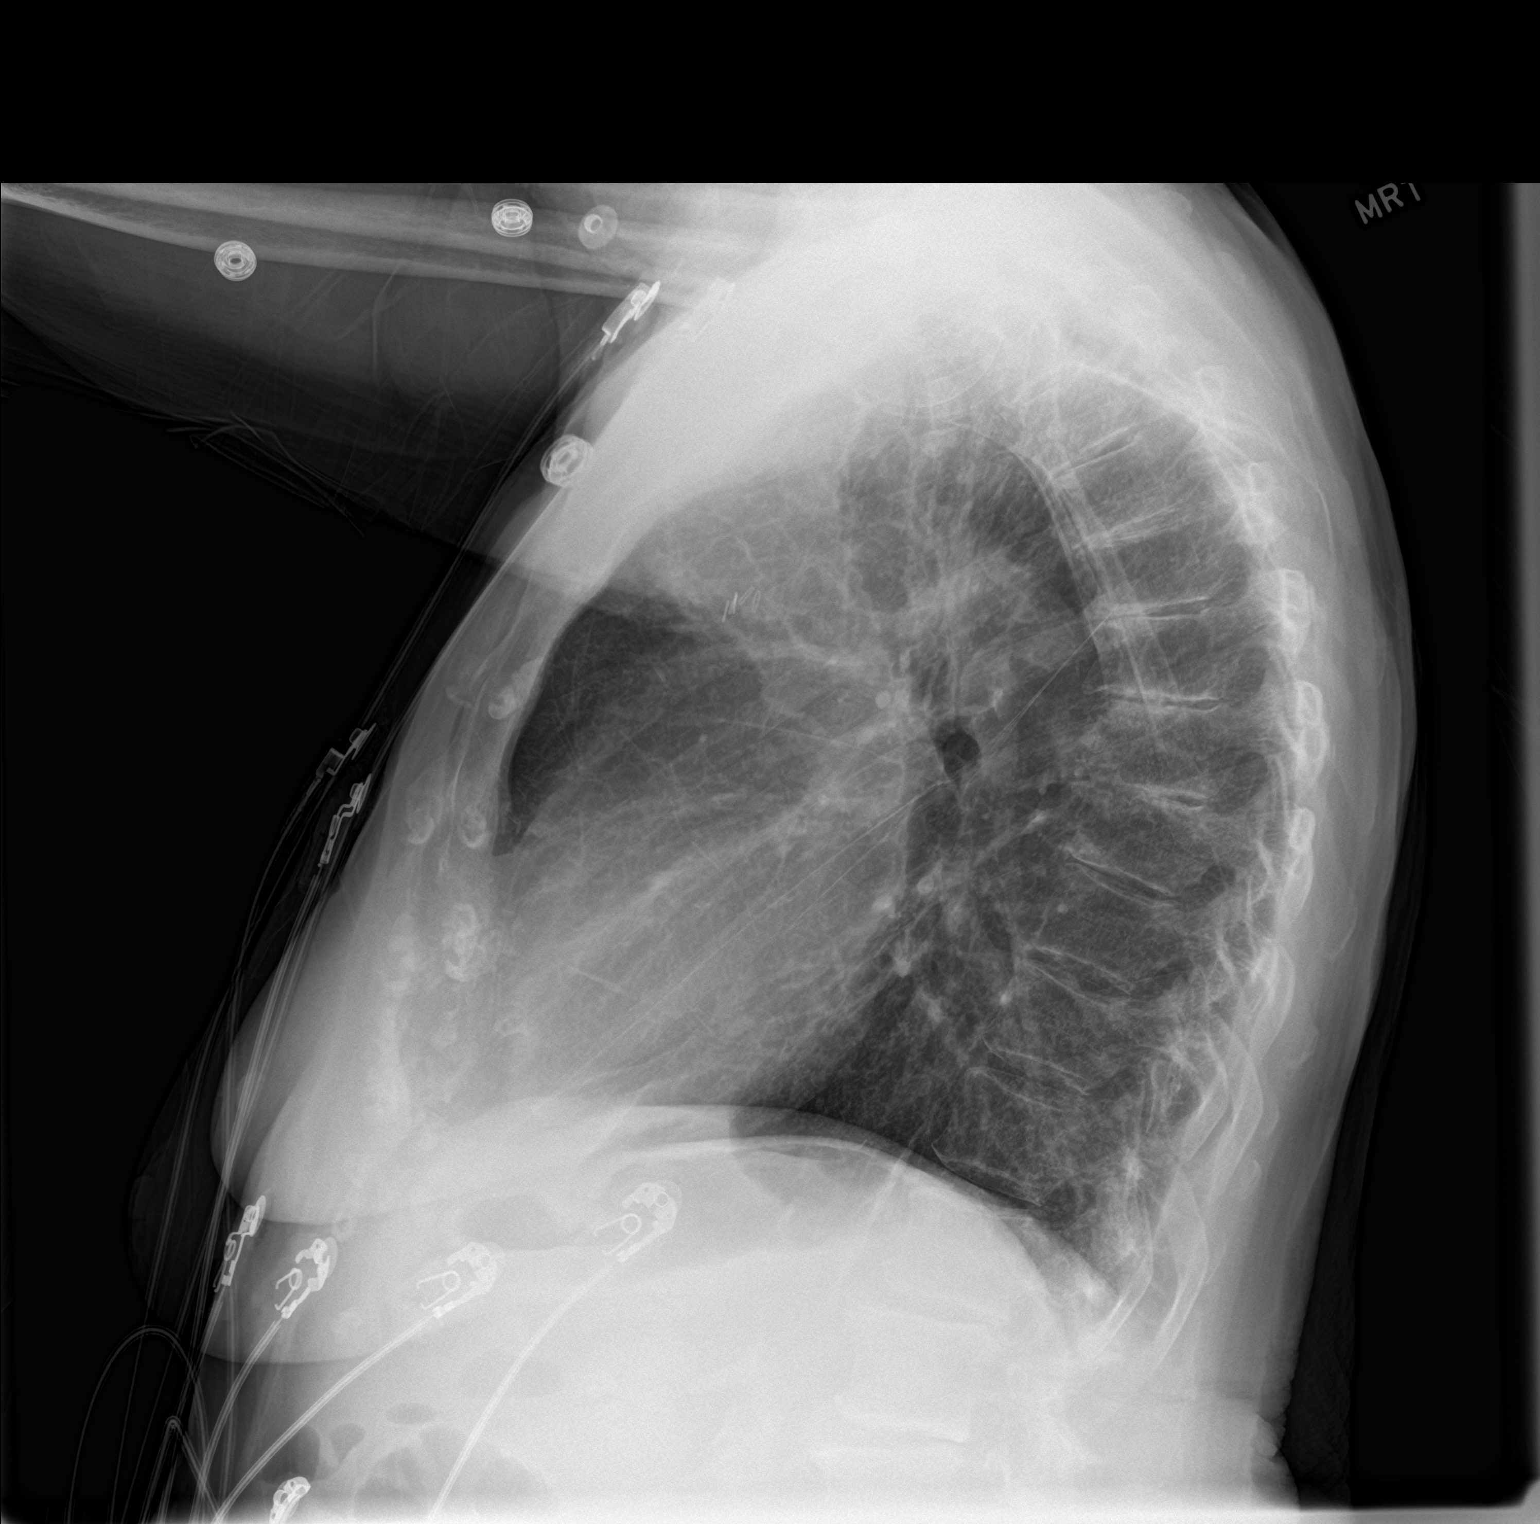

[2 of 2 positions shown; findings below may reference images not displayed]

FINDINGS: The heart size and mediastinal contours are within normal limits.
Both lungs are clear. No pleural effusion. No acute osseous
abnormality.
IMPRESSION: No acute process in the chest.

## 2023-06-08 IMAGING — CT CT HEAD W/O CM
3 series · 14 of 47 positions shown, 16 images · non-contrast
Comparison: 01/06/2021

CLINICAL DATA: Mental status change, unknown cause



[Series 3: head 5.0 h30s · axial · 0.39mm/px · z∈[-129,+1]mm · 8 of 32 slices shown, 10 images]
[im 3/32  brain]
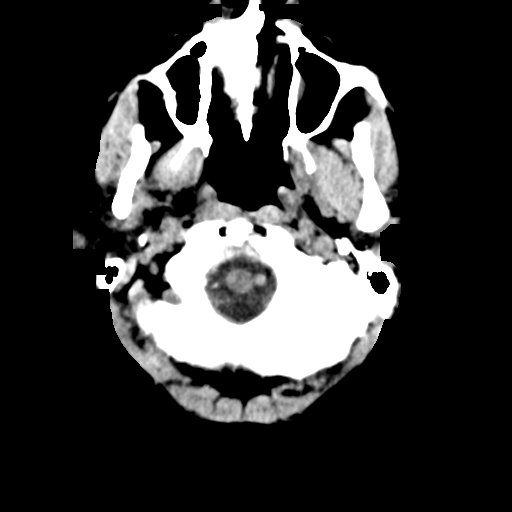
[im 3/32  bone]
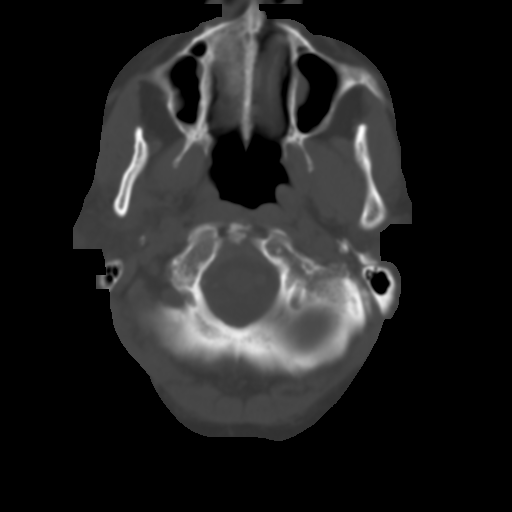
[im 7/32  brain]
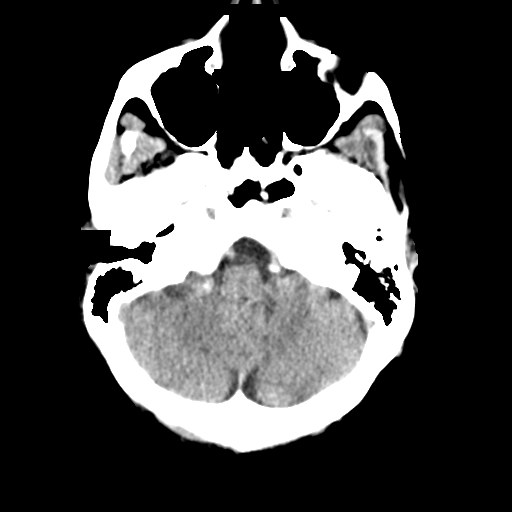
[im 10/32  brain]
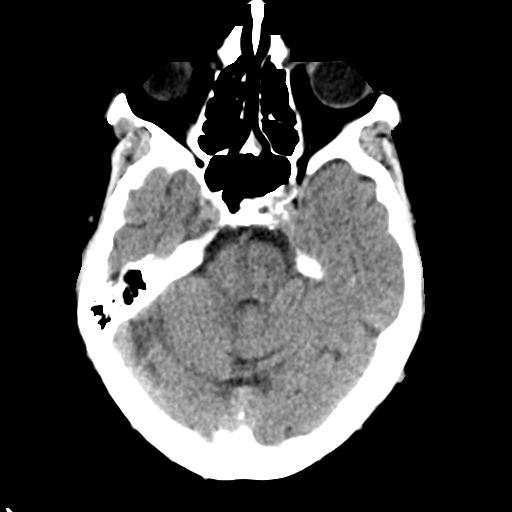
[im 14/32  brain]
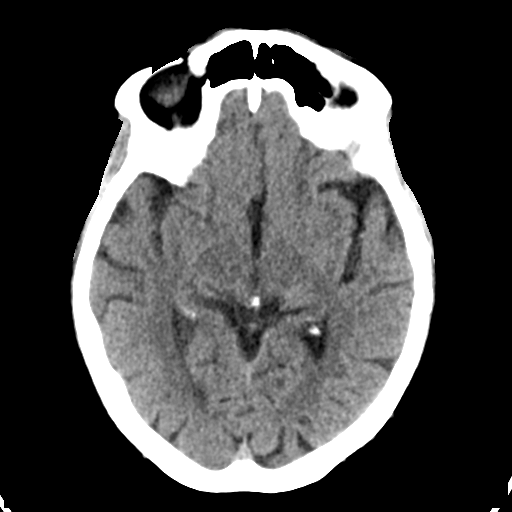
[im 18/32  brain]
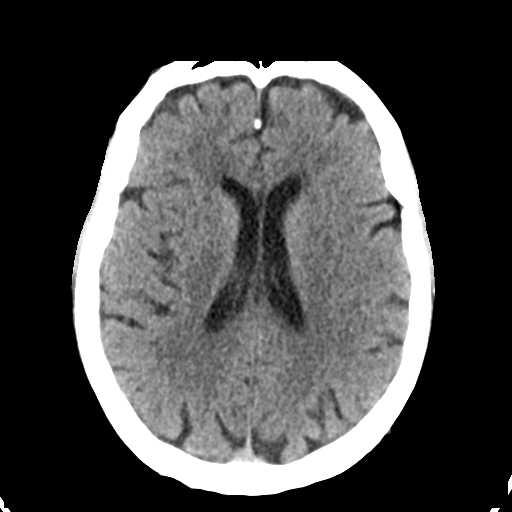
[im 18/32  bone]
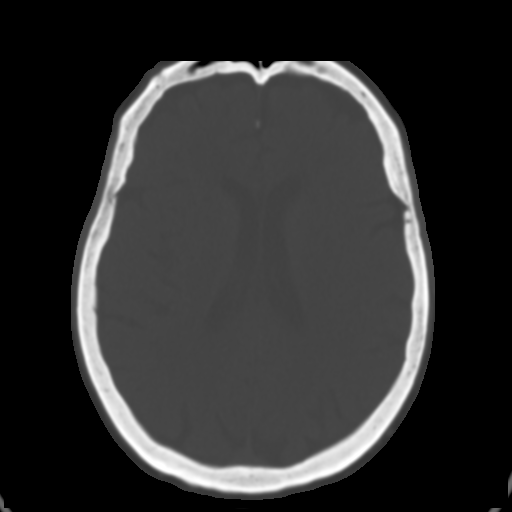
[im 22/32  brain]
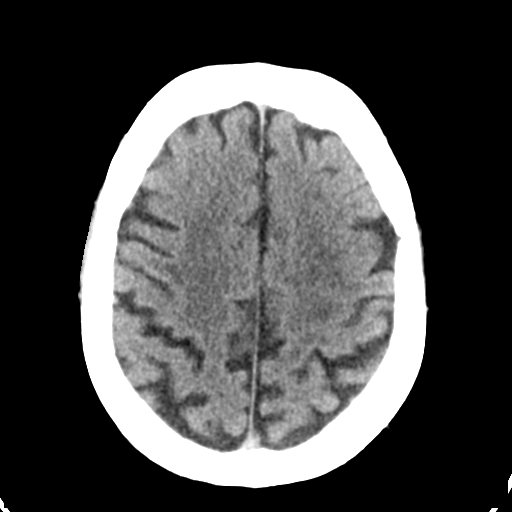
[im 25/32  brain]
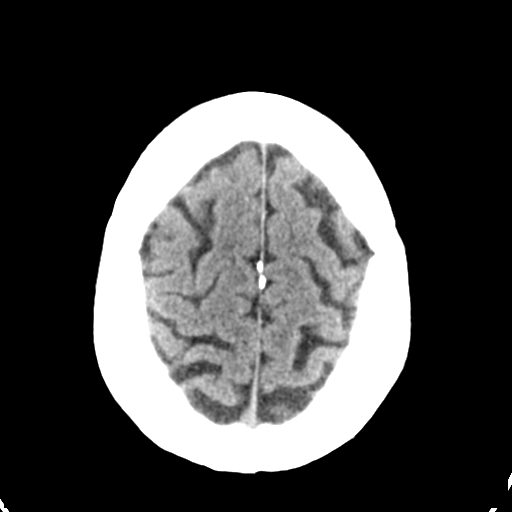
[im 29/32  brain]
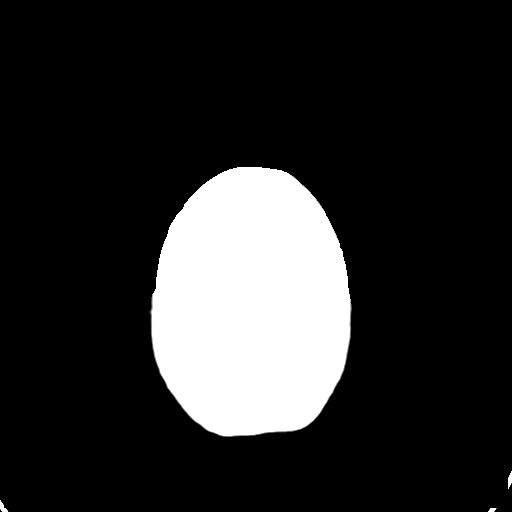

[Series 4: head 3.0 mpr cor · coronal · 0.30mm/px · 3 of 67 slices shown]
[im 23/67  brain]
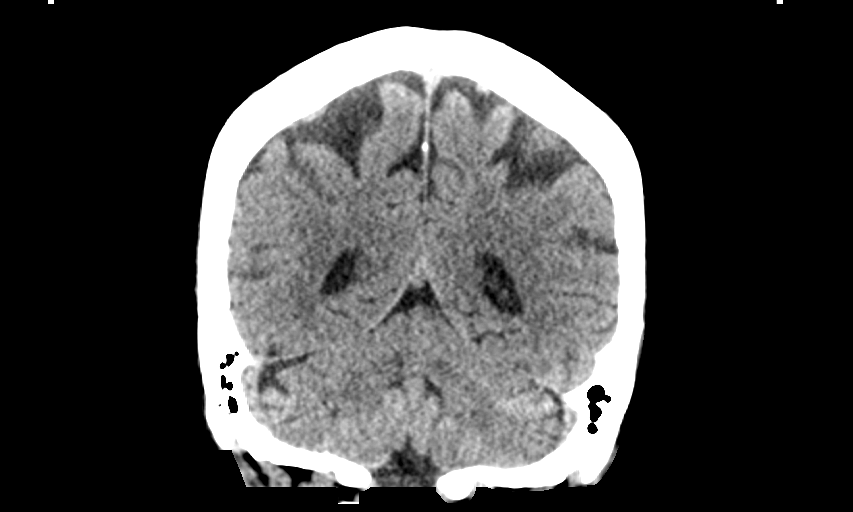
[im 30/67  brain]
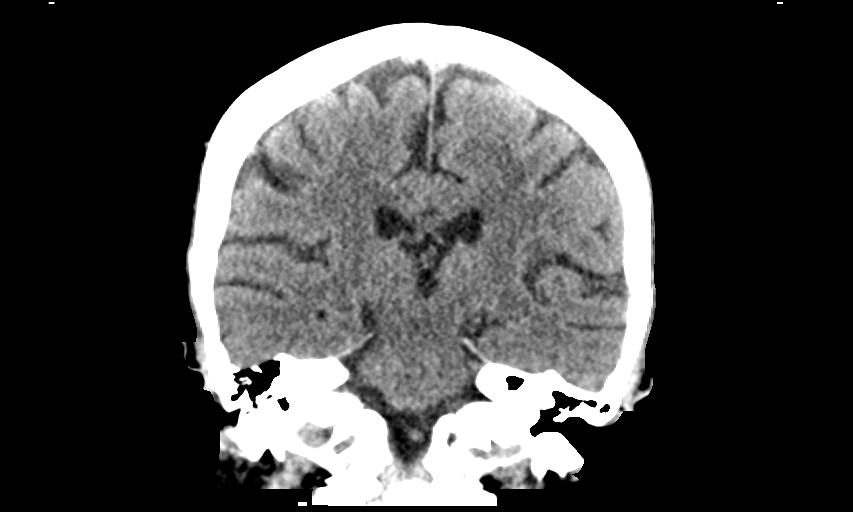
[im 37/67  brain]
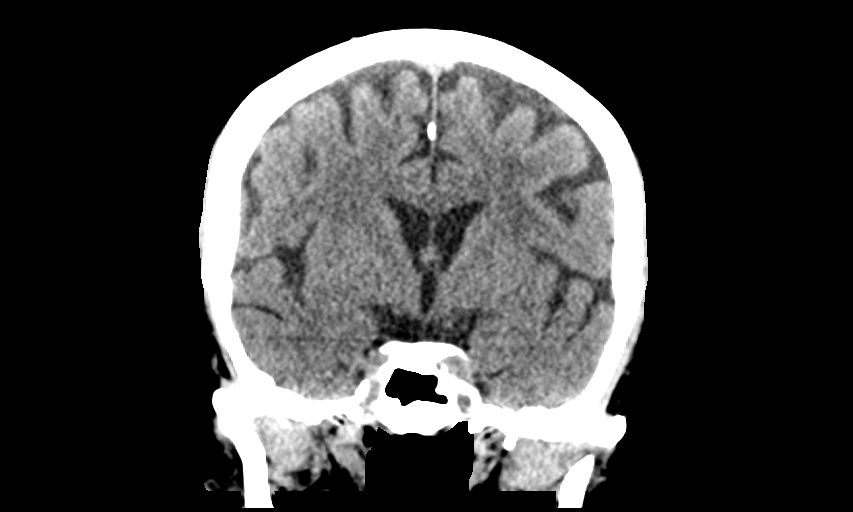

[Series 5: head 3.0 mpr sag · sagittal · 0.30mm/px · 3 of 63 slices shown]
[im 21/63  brain]
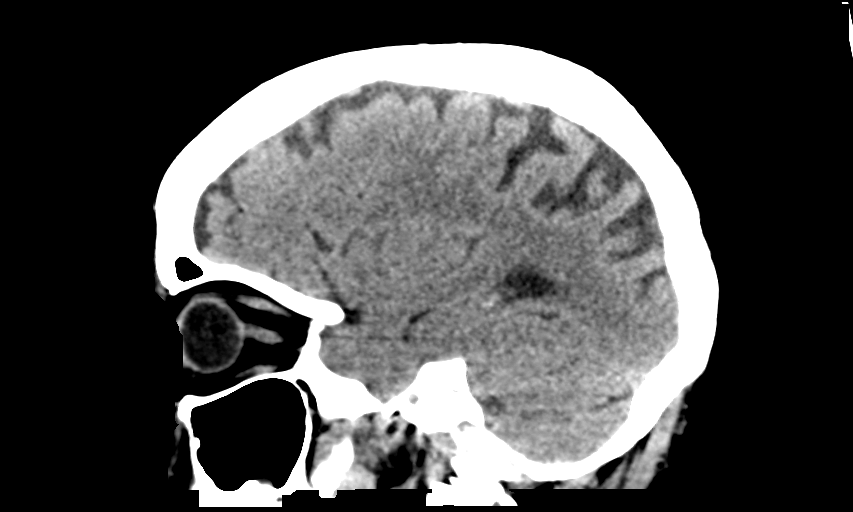
[im 32/63  brain]
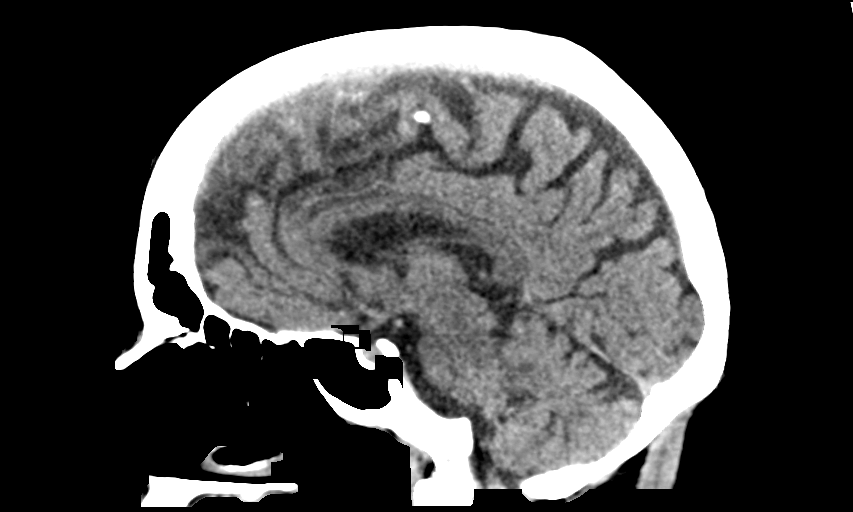
[im 42/63  brain]
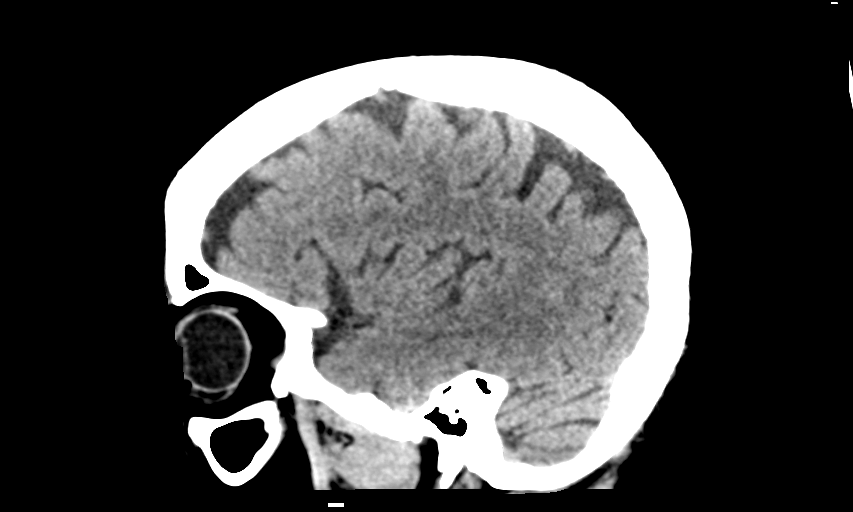

[14 of 47 positions shown; findings below may reference images not displayed]

FINDINGS: Brain: There is no acute intracranial hemorrhage, mass effect, or
edema. Gray-white differentiation is preserved. There is no
extra-axial fluid collection. Ventricles and sulci are stable and
within normal limits in size and configuration. Patchy
hypoattenuation in the supratentorial white matter is nonspecific
but probably reflects stable mild chronic microvascular ischemic
changes.

Vascular: There is atherosclerotic calcification at the skull base.

Skull: Calvarium is unremarkable.

Sinuses/Orbits: No acute finding.

Other: None.
IMPRESSION: No acute intracranial abnormality.

## 2023-06-09 NOTE — Unmapped (Signed)
 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 Issues to be discussed:   -The current regimen of low dose Encorafenib 150 mg daily and Binimetinib 15 mg bid, administered intermittently (9 days on, 21 days off), continues to be well tolerated. She will continue under the same regimen.  -We reviewed her scans. They are stable in principle, in fact, several lesions are smaller. However, a new FDG avid nodule on the right thyroid was identified on the recent PET CT scan. The nodule is more prominent than in previous scans. The differential diagnosis includes inflammation or malignancy. She has agreed to proceed with an ultrasound-guided biopsy to determine the nature of the nodule. The decision to biopsy is based on the need to rule out malignancy and provide reassurance. Will discuss biopsy results over the phone.    Diagnosis: Melanoma, cutaneous primary  Other: None  Stage: M1b  Current Therapy: Enco-bini (150-15) 9 days on 21 days off.      REFERRING PROVIDER  Self-referral    ONCOLOGY HISTORY  -mid summer 2021; changing (raising, itching) L upper back lesion  -10/12/19; melanoma, superficial spreading type, Breslow thickness 3.21mm, no ulceration, no satellitosis, 11 mitoses/mm2, no regression, non-brisk tumor-infiltrating lymphocytes ([id number]).  -11/04/19;  wide excision and sentinel lymph node mapping c/w residual melanoma, superficial spreading type, Breslow thickness 0.2 mm, 0 mitoses per millimeter square, small incidental compound nevus.  Single left axillary sentinel lymph node was removed and was positive for endometrial metastatic foci of melanoma (sizes of 2 mm, 0.8 mm, and 0.1 mm).  -11/30/19;  significant for a 6 mm left thyroid lobe nodule partially calcified and mildly FDG avid.  Mild FDG avidity in the left axilla of the largest left axillary lymph node measuring 1.4 cm (SUV max of 2.8).  -02/01/20; started adjuvant single-agent nivolumab  -02/19/20; last (second) dose of nivolumab (120mg ); treatment was held due to development of constitutional symptoms.   -07/07/20; punch mid back lesion showed metastatic melanoma ([id number], [facility]).   -08/03/21; palliative resection of the L upper back lump c/w metastatic melanoma  ([id number], [facility]).   -11/28/21; cancer progression to lungs, soft tissues.  -04/12/22; started enco-bini (04/09/22)  - 09/28/22: [facility] biopsied arm and it came back as thin melanoma    SUBJECTIVE  Taylor Adkins is a 89 y.o. female who comes for her regular every 3 month radiographic scans while on intermittent enco-bini. Since last visit she has been taking Braftovi 150 mg daily and Mektovi 50 mg twice a day for nine days, followed by a 21-day break. The treatment is well tolerated, and she continues to adhere to the regimen.     She experiences fatigue, especially during the nine days on medication, but this improves after stopping the medication. She sleeps six to eight hours per night, often staying up late reading, and does not nap during the day. Her appetite is stable, and she eats with family several times a week. She walks about a mile daily, weather permitting, and does not experience significant joint pain, although her shoulders occasionally ache. No joint aches, nausea, or diarrhea, except for occasional shoulder aches.    MEDICATIONS  Amlodipine 10 mg, atorvastatin 20 mg, binimetinib 15 mg bid (1 week on, 3 weeks off), encorafenib 150 mg daily (1 week on, 3 weeks off), multivitamin daily, olmesartan 40 mg daily, olopatadine 0.1% ophthalmic solution, sertraline 50 mg daily, spironolactone 25 mg daily.    OBJECTIVE  GENERAL:  [name]  Comp appears alert, oriented, and cooperative.  VITALS: P- 64, BP- 144/82, RR- 18, SaO2- 98%  MEASUREMENTS: Weight- 56.7.  GENERAL: No acute distress, pleasant.  NECK: No cervical, supraclavicular, axillary, or inguinal adenopathy.  CHEST: Lungs with little scintillation.  CARDIOVASCULAR: Regular rate and rhythm.  ABDOMEN: Abdomen soft, non-tender. No organomegaly, no equilateral adenopathy.  EXTREMITIES: 1+ bilateral lower extremity edema.    LABORATORY DATA  All labs (CBC w/diff, CHEM20, TFTs) are normal except chloride 108, BUN 27, Ca 10.5, AlPhos 43.    RADIOGRAPHIC DATA  Whole body PET CT: FDG avid lymph node with SUV 3.9 posterior to right thyroid tissue measuring 1.7 x 1.3 cm, maxillary sinus mucosal thickening, mild sclerotic thickening of calvarium, right thyroid nodule FDG avid with SUV unspecified, right lower lobe 0.7 cm non-FDG avid lung nodule, right middle lobe 0.5 cm non-FDG avid lung nodule, moderate coronary calcifications, diffuse diverticulosis, new FDG avid focus within soft tissue of medial palms on both hands, degenerative changes along bilateral shoulders (05/31/2023).

## 2023-06-10 ENCOUNTER — Ambulatory Visit (INDEPENDENT_AMBULATORY_CARE_PROVIDER_SITE_OTHER): Payer: Medicare Other

## 2023-06-10 DIAGNOSIS — R55 Syncope and collapse: Secondary | ICD-10-CM | POA: Diagnosis not present

## 2023-06-10 DIAGNOSIS — C4359 Malignant melanoma of other part of trunk: Principal | ICD-10-CM

## 2023-06-10 NOTE — Addendum Note (Signed)
 Addended by: Geralyn Flash D on: 06/10/2023 01:04 PM   Modules accepted: Orders

## 2023-06-10 NOTE — Progress Notes (Signed)
 [facility] Summary Report / Loop Recorder

## 2023-06-11 LAB — CUP PACEART REMOTE DEVICE CHECK
Date Time Interrogation Session: 20250406231247
Implantable Pulse Generator Implant Date: 20230613

## 2023-06-20 ENCOUNTER — Inpatient Hospital Stay: Admit: 2023-06-20 | Discharge: 2023-06-21 | Payer: Medicare (Managed Care)

## 2023-06-25 DIAGNOSIS — C4359 Malignant melanoma of other part of trunk: Principal | ICD-10-CM

## 2023-07-02 DIAGNOSIS — C4359 Malignant melanoma of other part of trunk: Principal | ICD-10-CM

## 2023-07-04 NOTE — Unmapped (Signed)
 [facility] Specialty and Home Delivery Pharmacy Refill Coordination Note    Specialty Medication(s) to be Shipped:   Hematology/Oncology: Braftovi    Other medication(s) to be shipped: No additional medications requested for fill at this time     Taylor Adkins, DOB: 1926-11-27  Phone: [phone] (home)       All above HIPAA information was verified with patient.     Was a Nurse, learning disability used for this call? No    Completed refill call assessment today to schedule patient's medication shipment from the [facility] and Home Delivery Pharmacy  [phone]).  All relevant notes have been reviewed.     Specialty medication(s) and dose(s) confirmed: Regimen is correct and unchanged.   Changes to medications: Nataliah reports no changes at this time.  Changes to insurance: No  New side effects reported not previously addressed with a pharmacist or physician: None reported  Questions for the pharmacist: No    Confirmed patient received a Conservation officer, historic buildings and a Surveyor, mining with first shipment. The patient will receive a drug information handout for each medication shipped and additional FDA Medication Guides as required.       DISEASE/MEDICATION-SPECIFIC INFORMATION        N/A    SPECIALTY MEDICATION ADHERENCE     Medication Adherence    Patient reported X missed doses in the last month: 0  Specialty Medication: BRAFTOVI 75 mg capsule  Patient is on additional specialty medications: No              Were doses missed due to medication being on hold? No    Braftovi 75 mg: 7 days of medicine on hand        REFERRAL TO PHARMACIST     Referral to the pharmacist: Not needed      [facility]     Shipping address confirmed in Epic.     Cost and Payment: Patient has a $0 copay, payment information is not required.    Delivery Scheduled: Yes, Expected medication delivery date: 07/09/23.     Medication will be delivered via UPS to the prescription address in Epic WAM.    Canary Ceo   [facility] Special[facility] and Home Delivery Pharmacy Specialty Technician

## 2023-07-06 MED ORDER — SPIRONOLACTONE 25 MG TABLET
ORAL_TABLET | Freq: Every day | ORAL | 3 refills | 0.00000 days
Start: 2023-07-06 — End: ?

## 2023-07-08 DIAGNOSIS — C4359 Malignant melanoma of other part of trunk: Principal | ICD-10-CM

## 2023-07-08 NOTE — Unmapped (Signed)
 Taylor Adkins 's BRAFTOVI 75 mg capsule (encorafenib) shipment will be delayed as a result of a high copay.     I have reached out to the patient  at [phone] and communicated the delay. We will call the patient back to reschedule the delivery upon resolution. We have not confirmed the new delivery date.

## 2023-07-08 NOTE — Unmapped (Signed)
 Pt is requesting refill    Most recent clinic visit: 05/31/2023  Next clinic visit:  11/22/2023

## 2023-07-09 MED FILL — BRAFTOVI 75 MG CAPSULE: ORAL | 90 days supply | Qty: 90 | Fill #2

## 2023-07-09 NOTE — Unmapped (Signed)
 Taylor Adkins 's BRAFTOVI 75 mg capsule (encorafenib) shipment will be rescheduled as a result of copay is now approved by patient/caregiver.      I have reached out to the patient  via incoming phone call and communicated the delay. We will reschedule the medication for the delivery date that the patient agreed upon.  We have confirmed the delivery date as 07/10/23, via ups.

## 2023-07-15 ENCOUNTER — Ambulatory Visit (INDEPENDENT_AMBULATORY_CARE_PROVIDER_SITE_OTHER): Payer: Medicare Other

## 2023-07-15 DIAGNOSIS — R55 Syncope and collapse: Secondary | ICD-10-CM

## 2023-07-16 LAB — CUP PACEART REMOTE DEVICE CHECK
Date Time Interrogation Session: 20250511233855
Implantable Pulse Generator Implant Date: 20230613

## 2023-07-17 ENCOUNTER — Ambulatory Visit: Payer: Self-pay | Admitting: Cardiology

## 2023-07-31 NOTE — Addendum Note (Signed)
 Addended by: Edra Govern D on: 07/31/2023 03:07 PM   Modules accepted: Orders

## 2023-07-31 NOTE — Progress Notes (Signed)
 [facility] Summary Report / Loop Recorder

## 2023-08-15 ENCOUNTER — Ambulatory Visit (INDEPENDENT_AMBULATORY_CARE_PROVIDER_SITE_OTHER)

## 2023-08-15 DIAGNOSIS — R55 Syncope and collapse: Secondary | ICD-10-CM | POA: Diagnosis not present

## 2023-08-19 ENCOUNTER — Ambulatory Visit: Payer: Medicare Other

## 2023-08-20 ENCOUNTER — Ambulatory Visit: Payer: Self-pay | Admitting: Cardiology

## 2023-08-23 DIAGNOSIS — C4359 Malignant melanoma of other part of trunk: Principal | ICD-10-CM

## 2023-08-23 MED ORDER — MEKTOVI 15 MG TABLET
ORAL_TABLET | Freq: Two times a day (BID) | ORAL | 11 refills | 30.00000 days
Start: 2023-08-23 — End: ?

## 2023-08-23 NOTE — Unmapped (Signed)
 Taylor Adkins has been contacted in regards to their refill of MEKTOVI  15 mg tablet (binimetinib ). At this time, they have declined refill due to 3 weeks of medication left. Refill assessment call date has been updated per the patient's request.

## 2023-09-03 NOTE — Progress Notes (Signed)
 [facility] Summary Report / Loop Recorder

## 2023-09-03 NOTE — Addendum Note (Signed)
 Addended by: TAWNI DRILLING D on: 09/03/2023 05:13 PM   Modules accepted: Orders

## 2023-09-09 DIAGNOSIS — C4359 Malignant melanoma of other part of trunk: Principal | ICD-10-CM

## 2023-09-09 MED ORDER — BRAFTOVI 75 MG CAPSULE
ORAL_CAPSULE | Freq: Every day | ORAL | 11 refills | 30.00000 days
Start: 2023-09-09 — End: ?

## 2023-09-09 MED ORDER — MEKTOVI 15 MG TABLET
ORAL_TABLET | Freq: Two times a day (BID) | ORAL | 11 refills | 30.00000 days
Start: 2023-09-09 — End: ?

## 2023-09-09 NOTE — Unmapped (Signed)
 Taylor Adkins has been contacted in regards to their refill of Mektovi  15mg . At this time, they have declined refill due to patient having 9 doses remaining. Refill assessment call date has been updated per the patient's request.

## 2023-09-11 DIAGNOSIS — C4359 Malignant melanoma of other part of trunk: Principal | ICD-10-CM

## 2023-09-11 MED ORDER — MEKTOVI 15 MG TABLET
ORAL_TABLET | Freq: Two times a day (BID) | ORAL | 11 refills | 30.00000 days
Start: 2023-09-11 — End: ?

## 2023-09-11 MED ORDER — BRAFTOVI 75 MG CAPSULE
ORAL_CAPSULE | Freq: Every day | ORAL | 11 refills | 30.00000 days
Start: 2023-09-11 — End: ?

## 2023-09-13 MED ORDER — ENCORAFENIB 75 MG CAPSULE
ORAL_CAPSULE | Freq: Every day | ORAL | 11 refills | 30.00000 days | Status: CP
Start: 2023-09-13 — End: ?

## 2023-09-13 MED ORDER — MEKTOVI 15 MG TABLET
ORAL_TABLET | Freq: Two times a day (BID) | ORAL | 11 refills | 30.00000 days | Status: CP
Start: 2023-09-13 — End: ?
  Filled 2023-10-02: qty 42, 84d supply, fill #0

## 2023-09-16 ENCOUNTER — Ambulatory Visit: Payer: Self-pay | Admitting: Cardiology

## 2023-09-16 ENCOUNTER — Ambulatory Visit

## 2023-09-16 DIAGNOSIS — R55 Syncope and collapse: Secondary | ICD-10-CM

## 2023-09-16 LAB — CUP PACEART REMOTE DEVICE CHECK
Date Time Interrogation Session: 20250713233933
Implantable Pulse Generator Implant Date: 20230613

## 2023-09-23 ENCOUNTER — Ambulatory Visit: Payer: Medicare Other

## 2023-09-30 NOTE — Unmapped (Signed)
 [facility] Specialty and Home Delivery Pharmacy Refill Coordination Note    Specialty Medication(s) to be Shipped:   Hematology/Oncology: Mektovi     Braftovi  not due at this time due to quantity on hand    Other medication(s) to be shipped: No additional medications requested for fill at this time     Taylor Adkins, DOB: 1926-11-04  Phone: [phone] (home)       All above HIPAA information was verified with patient's family member, Taylor Adkins.     Was a Nurse, learning disability used for this call? No    Completed refill call assessment today to schedule patient's medication shipment from the [facility] and Home Delivery Pharmacy  [phone]).  All relevant notes have been reviewed.     Specialty medication(s) and dose(s) confirmed: Regimen is correct and unchanged.   Changes to medications: Taylor Adkins reports no changes at this time.  Changes to insurance: No  New side effects reported not previously addressed with a pharmacist or physician: None reported  Questions for the pharmacist: No    Confirmed patient received a Conservation officer, historic buildings and a Surveyor, mining with first shipment. The patient will receive a drug information handout for each medication shipped and additional FDA Medication Guides as required.       DISEASE/MEDICATION-SPECIFIC INFORMATION        N/A    SPECIALTY MEDICATION ADHERENCE     Medication Adherence    Patient reported X missed doses in the last month: 0  Specialty Medication: Mektovi  15mg               Were doses missed due to medication being on hold? No    Mektovi  15mg   : 2 days of medicine on hand       REFERRAL TO PHARMACIST     Referral to the pharmacist: Not needed      [facility]     Shipping address confirmed in Epic.     Cost and Payment: Patient has a $0 copay, payment information is not required.    Delivery Scheduled: Yes, Expected medication delivery date: 7/31.     Medication will be delivered via UPS to the prescription address in Epic WAM.    Taylor Adkins   [facility] Specialty and Home Delivery Pharmacy  Specialty Technician

## 2023-10-04 NOTE — Progress Notes (Signed)
 [facility] Summary Report / Loop Recorder

## 2023-10-04 NOTE — Addendum Note (Signed)
 Addended by: VICCI SELLER A on: 10/04/2023 09:45 AM   Modules accepted: Orders

## 2023-10-08 MED ORDER — SPIRONOLACTONE 25 MG TABLET
ORAL_TABLET | Freq: Every day | ORAL | 3 refills | 0.00000 days
Start: 2023-10-08 — End: ?

## 2023-10-17 ENCOUNTER — Ambulatory Visit (INDEPENDENT_AMBULATORY_CARE_PROVIDER_SITE_OTHER)

## 2023-10-17 DIAGNOSIS — R55 Syncope and collapse: Secondary | ICD-10-CM | POA: Diagnosis not present

## 2023-10-17 LAB — CUP PACEART REMOTE DEVICE CHECK
Date Time Interrogation Session: 20250813232541
Implantable Pulse Generator Implant Date: 20230613

## 2023-10-19 ENCOUNTER — Ambulatory Visit: Payer: Self-pay | Admitting: Cardiology

## 2023-10-28 ENCOUNTER — Ambulatory Visit: Payer: Medicare Other

## 2023-11-06 NOTE — Unmapped (Signed)
 Taylor Adkins [facility]) has been contacted in regards to their refill of Braftovi  (Mektovi  not needed yet). At this time, they have declined refill due to waiting until appointment on 9/19, patient takes Braftovi  1 week on, 3 weeks off currently. Refill assessment call date has been updated per the patient's request.

## 2023-11-18 ENCOUNTER — Ambulatory Visit (INDEPENDENT_AMBULATORY_CARE_PROVIDER_SITE_OTHER)

## 2023-11-18 DIAGNOSIS — R55 Syncope and collapse: Secondary | ICD-10-CM | POA: Diagnosis not present

## 2023-11-19 ENCOUNTER — Ambulatory Visit: Payer: Self-pay | Admitting: Cardiology

## 2023-11-19 DIAGNOSIS — C4359 Malignant melanoma of other part of trunk: Principal | ICD-10-CM

## 2023-11-19 LAB — CUP PACEART REMOTE DEVICE CHECK
Date Time Interrogation Session: 20250913231310
Implantable Pulse Generator Implant Date: 20230613

## 2023-11-22 ENCOUNTER — Inpatient Hospital Stay: Admit: 2023-11-22 | Discharge: 2023-11-22 | Payer: Medicare (Managed Care)

## 2023-11-22 ENCOUNTER — Ambulatory Visit
Admit: 2023-11-22 | Discharge: 2023-11-22 | Payer: Medicare (Managed Care) | Attending: Hematology & Oncology | Primary: Hematology & Oncology

## 2023-11-22 DIAGNOSIS — C4359 Malignant melanoma of other part of trunk: Principal | ICD-10-CM

## 2023-11-22 MED ADMIN — Fluorine F-18 FDG 4-40 mCi IV: 8.54 | INTRAVENOUS | @ 13:00:00 | Stop: 2023-11-22

## 2023-11-25 DIAGNOSIS — C4359 Malignant melanoma of other part of trunk: Principal | ICD-10-CM

## 2023-11-25 MED ORDER — BRAFTOVI 75 MG CAPSULE
ORAL_CAPSULE | Freq: Every day | ORAL | 11 refills | 30.00000 days
Start: 2023-11-25 — End: ?

## 2023-11-25 MED ORDER — MEKTOVI 15 MG TABLET
ORAL_TABLET | Freq: Two times a day (BID) | ORAL | 11 refills | 30.00000 days
Start: 2023-11-25 — End: ?

## 2023-11-25 NOTE — Progress Notes (Signed)
 Remote Loop Recorder Transmission

## 2023-11-26 MED ORDER — MEKTOVI 15 MG TABLET
ORAL_TABLET | Freq: Two times a day (BID) | ORAL | 11 refills | 30.00000 days | Status: CP
Start: 2023-11-26 — End: ?
  Filled 2023-12-03: qty 54, 90d supply, fill #0

## 2023-11-26 MED ORDER — BRAFTOVI 75 MG CAPSULE
ORAL_CAPSULE | Freq: Every day | ORAL | 11 refills | 30.00000 days | Status: CP
Start: 2023-11-26 — End: ?
  Filled 2024-01-06: qty 60, 90d supply, fill #0

## 2023-11-26 NOTE — Unmapped (Signed)
 [facility] Specialty and Home Delivery Pharmacy Refill Coordination Note    Specialty Medication(s) to be Shipped:   Hematology/Oncology: Mektovi     Other medication(s) to be shipped: No additional medications requested for fill at this time    Specialty Medications not needed at this time: N/A     Taylor Adkins, DOB: May 20, 1926  Phone: [phone] (home)       All above HIPAA information was verified with patient's family member, daughter.     Was a Nurse, learning disability used for this call? No    Completed refill call assessment today to schedule patient's medication shipment from the [facility] and Home Delivery Pharmacy  [phone]).  All relevant notes have been reviewed.     Specialty medication(s) and dose(s) confirmed: Patient reports changes to the regimen as follows: 1-15mg  tablet two times a day for 9 days on and 21 off   Changes to medications: Taylor Adkins reports no changes at this time.  Changes to insurance: No  New side effects reported not previously addressed with a pharmacist or physician: None reported  Questions for the pharmacist: No    Confirmed patient received a Conservation officer, historic buildings and a Surveyor, mining with first shipment. The patient will receive a drug information handout for each medication shipped and additional FDA Medication Guides as required.       DISEASE/MEDICATION-SPECIFIC INFORMATION        N/A    SPECIALTY MEDICATION ADHERENCE     Medication Adherence    Patient reported X missed doses in the last month: 0  Specialty Medication: Braftovi  75mg   Informant: child/children              Were doses missed due to medication being on hold? No    Mektovi  15 mg: 5 days of medicine on hand       REFERRAL TO PHARMACIST     Referral to the pharmacist: Not needed      [facility]     Shipping address confirmed in Epic.     Cost and Payment: Patient has a $0 copay, payment information is not required.    Delivery Scheduled: Yes, Expected medication delivery date: 12/04/23.     Medication will be delivered via UPS to the prescription address in Epic WAM.    Taylor Adkins, PharmD   [facility] Specialty and Home Delivery Pharmacy  Specialty Pharmacist

## 2023-11-26 NOTE — Unmapped (Signed)
 Clinical Assessment Needed For: Dose Change  Medication: Braftovi  75 mg  Last Fill Date/Day Supply: 07/29/23 / 90  Copay $0  Was previous dose already scheduled to fill: No    Notes to Pharmacist: New directions    Clinical Assessment Needed For: Dose Change  Medication: Mektovi  15 mg  Last Fill Date/Day Supply: 10/02/23 / 84 days  Copay $0  Was previous dose already scheduled to fill: No    Notes to Pharmacist: New directions

## 2023-11-26 NOTE — Unmapped (Signed)
 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 Currently on intermittent enco-bini over the last 18 months with partial response. Issues to be discussed:   -We are pleased about the continuing good tolerance to low-dose enco-bini without any new or surprising side effects.   -We are pleased with today's PET/CT scans. Notable are the 'new' lung nodules but with a definitve inflammatory component. Patient does actually report cough which may suggest a subclinical respiratory infection. We are comfortable to extend radiographic followups to 9 months. I asked her to followup on this cough; this is expected to resolve in a few weeks if it is infectious (as expected). She can call us  if it does not resolve, or even gets worse.  -We will continue with the low-dose enco-bini. She has good quality of life and she is happy, physically and mentally. I praised her about her attitude towards her cancer (she cannot beat it but can live with it!).    Diagnosis: Melanoma, cutaneous primary  Other: None  Stage: M1b  Current Therapy: Enco-bini (150-15) 9 days on 21 days off.      REFERRING PROVIDER  Self-referral    ONCOLOGY HISTORY  -mid summer 2021; changing (raising, itching) L upper back lesion  -10/12/19; melanoma, superficial spreading type, Breslow thickness 3.54mm, no ulceration, no satellitosis, 11 mitoses/mm2, no regression, non-brisk tumor-infiltrating lymphocytes ([id number]).  -11/04/19;  wide excision and sentinel lymph node mapping c/w residual melanoma, superficial spreading type, Breslow thickness 0.2 mm, 0 mitoses per millimeter square, small incidental compound nevus.  Single left axillary sentinel lymph node was removed and was positive for endometrial metastatic foci of melanoma (sizes of 2 mm, 0.8 mm, and 0.1 mm).  -11/30/19;  significant for a 6 mm left thyroid lobe nodule partially calcified and mildly FDG avid.  Mild FDG avidity in the left axilla of the largest left axillary lymph node measuring 1.4 cm (SUV max of 2.8).  -02/01/20; started adjuvant single-agent nivolumab  -02/19/20; last (second) dose of nivolumab (120mg ); treatment was held due to development of constitutional symptoms.   -07/07/20; punch mid back lesion showed metastatic melanoma ([id number], [facility]).   -08/03/21; palliative resection of the L upper back lump c/w metastatic melanoma  ([id number], [facility]).   -11/28/21; cancer progression to lungs, soft tissues.  -04/12/22; started enco-bini (04/09/22)  - 09/28/22: [facility] biopsied arm and it came back as thin melanoma.    SUBJECTIVE  Lavelle Berland is a 89 y.o. female comes for her 6 month radiographic follow-up. She feels great, both physically and mentally. She continues to take enco-bini (150-15) 9 days per month. She tolerates it well without any cumulative side effects (I.e. every single cycle she is on, she expects same degree of side effects of slight fatige, joint aches). She walks up to amile a day. She reads. Her appetite is great. Same energy. Her daughter notes that she may be having a slight cough recently but without any fevers, or other upper respiratory symptoms.   Other systems were reviewed and were negative.     MEDICATIONS  Amlodipine 10 mg, atorvastatin 20 mg, binimetinib  15 mg bid (1 week on, 3 weeks off), encorafenib  150 mg daily (1 week on, 3 weeks off), multivitamin daily, olmesartan 40 mg daily, olopatadine 0.1% ophthalmic solution, sertraline 50 mg daily, spironolactone  25 mg daily.     OBJECTIVE  GENERAL:  Lindalee Huizinga appears alert, oriented, and cooperative.  Weight 56.6kg, Temp 36.1, HR 76, RR 18, BP 166/73, O2SATS 96%,  PERFORMANCE STATUS ECOG 1  GENERAL: No acute distress, pleasant.  NECK: No cervical, supraclavicular, axillary, or inguinal adenopathy.  CHEST: Lungs with little scintillation.  CARDIOVASCULAR: Regular rate and rhythm.  ABDOMEN: Abdomen soft, non-tender. No organomegaly, no equilateral adenopathy.  EXTREMITIES: 1+ bilateral lower extremity edema    RADIOGRAPHIC DATA  Whole body PET/CT scan was performed today. New RUL 5 mm groundglass nodule that is mildly FDG-avid. New 5 mm RUL groundglass nodule that is mildly FDG-avid. Stable 4 mm RML nodule. 5 mm RLL nodule that has slightly decrease. Subcentimeter FDG-avid R hilar lymph node. Cholelithiasis. Diverticulosis.

## 2023-11-27 NOTE — Progress Notes (Signed)
 Remote Loop Recorder Transmission

## 2023-11-28 ENCOUNTER — Telehealth: Payer: Self-pay

## 2023-11-28 NOTE — Telephone Encounter (Signed)
 Alert received:  Alert remote transmission: Pause Event occurred 9/24 @ 09:26, 16sec per device, EGM c/w non-conducted P waves   Outreach made to Pt's daughter.  She states she spoke with Pt after this event and Pt did not report any symptoms or syncope.  Per daughter, Pt has been sleeping later into the morning.    Daughter will contact Pt and determine if she was asleep during this episode or if she had any symptoms and call this nurse back.

## 2023-11-28 NOTE — Telephone Encounter (Signed)
 Call back received from daughter.  Per daughter, Pt had someone at her house yesterday during episode, so Pt was not in bed.  Pt states she does remember feeling tired yesterday morning and states she might have gone to sleep at that time.  Advised would discuss with Dr. Inocencio and call back with a plan.  Discussed with Dr. Inocencio.  Per Dr. Inocencio Pt needs to be seen to discuss PPM implant.  Ok to see EP APP to discuss/schedule.  Outreach made to daughter.  Advised that Dr. Inocencio would like Pt seen to discuss PPM implant.  Pt scheduled to see EP APP 11/29/2023 at 10:55 am.  All questions answered.

## 2023-11-29 ENCOUNTER — Ambulatory Visit: Attending: Pulmonary Disease | Admitting: Pulmonary Disease

## 2023-11-29 ENCOUNTER — Encounter: Payer: Self-pay | Admitting: Pulmonary Disease

## 2023-11-29 VITALS — BP 95/51 | HR 78 | Ht 60.0 in | Wt 125.2 lb

## 2023-11-29 DIAGNOSIS — R55 Syncope and collapse: Secondary | ICD-10-CM | POA: Diagnosis not present

## 2023-11-29 NOTE — Patient Instructions (Signed)
 Medication Instructions:  Your physician recommends that you continue on your current medications as directed. Please refer to the Current Medication list given to you today.  *If you need a refill on your cardiac medications before your next appointment, please call your pharmacy*  Lab Work: BMET, CBC-TODAY If you have labs (blood work) drawn today and your tests are completely normal, you will receive your results only by: MyChart Message (if you have MyChart) OR A paper copy in the mail If you have any lab test that is abnormal or we need to change your treatment, we will call you to review the results.  Testing/Procedures: See letter  Follow-Up: At [facility], you and your health needs are our priority.  As part of our continuing mission to provide you with exceptional heart care, our providers are all part of one team.  This team includes your primary Cardiologist (physician) and Advanced Practice Providers or APPs (Physician Assistants and Nurse Practitioners) who all work together to provide you with the care you need, when you need it.  Your next appointment:   Follow up will be arranged for you and print out on your discharge summary after your procedure.

## 2023-11-29 NOTE — H&P (View-Only) (Signed)
  Electrophysiology Office Note:   Date:  11/29/2023  ID:  Brandy Miranda, DOB 01-26-1927, MRN [id number]  Primary Cardiologist: Ozell Fell, MD Primary Heart Failure: None Electrophysiologist: Will Gladis Norton, MD      History of Present Illness:   Brandy Miranda is a 89 y.o. female with h/o LBBB, syncope, HTN, AS, HLD, melanoma (back and lung but stable x3 years) seen today for routine electrophysiology followup.   Alert received from MDT ILR for pause > 11/27/23 at 0926 per device, EGM with non-conducted P waves lasting 16 seconds. The patient was asymptomatic / may have been asleep per report.   Since last being seen in our clinic the patient reports after thinking about it she was very tired and and sat down prior to the event above.  She thought she fell asleep but is unsure.  She may have passed out.  She indicates she has had increased fatigue over the past several weeks. Her daughter has noted she has been short of breath at times which is unusual for her.     She denies chest pain, palpitations, dyspnea, PND, orthopnea, nausea, vomiting, dizziness, syncope, edema, weight gain, or early satiety.   Review of systems complete and found to be negative unless listed in HPI.   EP Information / Studies Reviewed:    EKG is ordered today. Personal review as below.  EKG Interpretation Date/Time:  Friday November 29 2023 10:51:48 EDT Ventricular Rate:  78 PR Interval:  180 QRS Duration:  128 QT Interval:  400 QTC Calculation: 456 R Axis:   39  Text Interpretation: Normal sinus rhythm Non-specific intra-ventricular conduction block Confirmed by Aniceto Jarvis ([id number]) on 11/29/2023 11:00:48 AM    Arrhythmia / AAD / Pertinent EP Studies LBBB   Device MDT ILR implanted 08/15/2021 for syncope   Risk Assessment/Calculations:              Physical Exam:   VS:  BP (!) 95/51   Pulse 78   Ht 5' (1.524 m)   Wt 125 lb 3.2 oz (56.8 kg)   SpO2 93%   BMI 24.45 kg/m    Wt Readings  from Last 3 Encounters:  11/29/23 125 lb 3.2 oz (56.8 kg)  05/02/22 125 lb (56.7 kg)  08/15/21 125 lb (56.7 kg)     GEN: Well nourished, well developed in no acute distress NECK: No JVD; No carotid bruits CARDIAC: Regular rate and rhythm, no murmurs, rubs, gallops RESPIRATORY:  Clear to auscultation without rales, wheezing or rhonchi  ABDOMEN: Soft, non-tender, non-distended EXTREMITIES:  No edema; No deformity     ASSESSMENT AND PLAN:    Prolonged Pause (16 seconds) / Intermittent CHB Symptomatic Bradycardia  LVEF 60-65% in 2023  -concerned for possible syncopal episode at home with above & not fell asleep -Dr. Norton previously reviewed in clinic and recommended PPM  -reviewed indications for PPM implant  -discussed risks / benefits, details of procedure  -avoid AV nodal blockers  -pre-procedure labs > CBC, BMP   -post implant care discussed -pt encouraged not to drive until after restrictions post implant  -plan for ILR removal at time of implant    Follow up with Dr. Norton as planned on 10/2 for PPM implant    Signed, Jarvis Aniceto, NP-C, AGACNP-BC [facility] - Electrophysiology  11/29/2023, 12:02 PM

## 2023-11-29 NOTE — Progress Notes (Addendum)
  Electrophysiology Office Note:   Date:  11/29/2023  ID:  Brandy Miranda, DOB 01-26-1927, MRN [id number]  Primary Cardiologist: Ozell Fell, MD Primary Heart Failure: None Electrophysiologist: Will Gladis Norton, MD      History of Present Illness:   Brandy Miranda is a 89 y.o. female with h/o LBBB, syncope, HTN, AS, HLD, melanoma (back and lung but stable x3 years) seen today for routine electrophysiology followup.   Alert received from MDT ILR for pause > 11/27/23 at 0926 per device, EGM with non-conducted P waves lasting 16 seconds. The patient was asymptomatic / may have been asleep per report.   Since last being seen in our clinic the patient reports after thinking about it she was very tired and and sat down prior to the event above.  She thought she fell asleep but is unsure.  She may have passed out.  She indicates she has had increased fatigue over the past several weeks. Her daughter has noted she has been short of breath at times which is unusual for her.     She denies chest pain, palpitations, dyspnea, PND, orthopnea, nausea, vomiting, dizziness, syncope, edema, weight gain, or early satiety.   Review of systems complete and found to be negative unless listed in HPI.   EP Information / Studies Reviewed:    EKG is ordered today. Personal review as below.  EKG Interpretation Date/Time:  Friday November 29 2023 10:51:48 EDT Ventricular Rate:  78 PR Interval:  180 QRS Duration:  128 QT Interval:  400 QTC Calculation: 456 R Axis:   39  Text Interpretation: Normal sinus rhythm Non-specific intra-ventricular conduction block Confirmed by Aniceto Jarvis ([id number]) on 11/29/2023 11:00:48 AM    Arrhythmia / AAD / Pertinent EP Studies LBBB   Device MDT ILR implanted 08/15/2021 for syncope   Risk Assessment/Calculations:              Physical Exam:   VS:  BP (!) 95/51   Pulse 78   Ht 5' (1.524 m)   Wt 125 lb 3.2 oz (56.8 kg)   SpO2 93%   BMI 24.45 kg/m    Wt Readings  from Last 3 Encounters:  11/29/23 125 lb 3.2 oz (56.8 kg)  05/02/22 125 lb (56.7 kg)  08/15/21 125 lb (56.7 kg)     GEN: Well nourished, well developed in no acute distress NECK: No JVD; No carotid bruits CARDIAC: Regular rate and rhythm, no murmurs, rubs, gallops RESPIRATORY:  Clear to auscultation without rales, wheezing or rhonchi  ABDOMEN: Soft, non-tender, non-distended EXTREMITIES:  No edema; No deformity     ASSESSMENT AND PLAN:    Prolonged Pause (16 seconds) / Intermittent CHB Symptomatic Bradycardia  LVEF 60-65% in 2023  -concerned for possible syncopal episode at home with above & not fell asleep -Dr. Norton previously reviewed in clinic and recommended PPM  -reviewed indications for PPM implant  -discussed risks / benefits, details of procedure  -avoid AV nodal blockers  -pre-procedure labs > CBC, BMP   -post implant care discussed -pt encouraged not to drive until after restrictions post implant  -plan for ILR removal at time of implant    Follow up with Dr. Norton as planned on 10/2 for PPM implant    Signed, Jarvis Aniceto, NP-C, AGACNP-BC [facility] - Electrophysiology  11/29/2023, 12:02 PM

## 2023-11-30 LAB — BASIC METABOLIC PANEL WITH GFR
BUN/Creatinine Ratio: 22 (ref 12–28)
BUN: 24 mg/dL (ref 10–36)
CO2: 18 mmol/L — ABNORMAL LOW (ref 20–29)
Calcium: 9.4 mg/dL (ref 8.7–10.3)
Chloride: 102 mmol/L (ref 96–106)
Creatinine, Ser: 1.1 mg/dL — ABNORMAL HIGH (ref 0.57–1.00)
Glucose: 86 mg/dL (ref 70–99)
Potassium: 5.1 mmol/L (ref 3.5–5.2)
Sodium: 136 mmol/L (ref 134–144)
eGFR: 46 mL/min/1.73 — ABNORMAL LOW (ref >59)

## 2023-11-30 LAB — CBC
Hematocrit: 35.4 % (ref 34.0–46.6)
Hemoglobin: 11.3 g/dL (ref 11.1–15.9)
MCH: 30.7 pg (ref 26.6–33.0)
MCHC: 31.9 g/dL (ref 31.5–35.7)
MCV: 96 fL (ref 79–97)
Platelets: 184 x10E3/uL (ref 150–450)
RBC: 3.68 x10E6/uL — ABNORMAL LOW (ref 3.77–5.28)
RDW: 13.3 % (ref 11.7–15.4)
WBC: 3.1 x10E3/uL — ABNORMAL LOW (ref 3.4–10.8)

## 2023-12-02 ENCOUNTER — Ambulatory Visit: Payer: Medicare Other

## 2023-12-04 NOTE — Pre-Procedure Instructions (Signed)
 Attempted to call patient regarding procedure instructions.  Left voicemail on daughter Brandy Miranda voicemail patient on the following items: Arrival time 2:30 Nothing to eat or drink after midnight No meds AM of procedure Responsible person to drive you home and stay with you for 24 hrs Wash with special soap night before and morning of procedure

## 2023-12-05 ENCOUNTER — Ambulatory Visit (HOSPITAL_COMMUNITY)
Admission: RE | Admit: 2023-12-05 | Discharge: 2023-12-05 | Disposition: A | Discharge status: Home / Self Care | Attending: Cardiology | Admitting: Cardiology

## 2023-12-05 ENCOUNTER — Other Ambulatory Visit: Payer: Self-pay

## 2023-12-05 ENCOUNTER — Telehealth: Payer: Self-pay

## 2023-12-05 ENCOUNTER — Ambulatory Visit (HOSPITAL_COMMUNITY)

## 2023-12-05 ENCOUNTER — Encounter (HOSPITAL_COMMUNITY)
Admission: RE | Disposition: A | Discharge status: Home / Self Care | Payer: Self-pay | Source: Home / Self Care | Attending: Cardiology

## 2023-12-05 DIAGNOSIS — I442 Atrioventricular block, complete: Secondary | ICD-10-CM | POA: Diagnosis present

## 2023-12-05 DIAGNOSIS — R001 Bradycardia, unspecified: Secondary | ICD-10-CM | POA: Insufficient documentation

## 2023-12-05 DIAGNOSIS — Z85820 Personal history of malignant melanoma of skin: Secondary | ICD-10-CM | POA: Diagnosis not present

## 2023-12-05 DIAGNOSIS — R55 Syncope and collapse: Secondary | ICD-10-CM | POA: Diagnosis not present

## 2023-12-05 DIAGNOSIS — I10 Essential (primary) hypertension: Secondary | ICD-10-CM | POA: Insufficient documentation

## 2023-12-05 DIAGNOSIS — E785 Hyperlipidemia, unspecified: Secondary | ICD-10-CM | POA: Diagnosis not present

## 2023-12-05 DIAGNOSIS — I447 Left bundle-branch block, unspecified: Secondary | ICD-10-CM | POA: Diagnosis not present

## 2023-12-05 HISTORY — PX: LOOP RECORDER REMOVAL: EP1215

## 2023-12-05 HISTORY — PX: PACEMAKER IMPLANT: EP1218

## 2023-12-05 SURGERY — PACEMAKER IMPLANT

## 2023-12-05 MED ORDER — CEFAZOLIN SODIUM-DEXTROSE 2-4 GM/100ML-% IV SOLN: 2.0000 g | INTRAVENOUS | Status: AC

## 2023-12-05 MED ORDER — HEPARIN (PORCINE) IN NACL 1000-0.9 UT/500ML-% IV SOLN
INTRAVENOUS | Status: DC | PRN
  Administered 2023-12-05: 500 mL

## 2023-12-05 MED ORDER — SODIUM CHLORIDE 0.9 % IV SOLN: INTRAVENOUS | Status: DC

## 2023-12-05 MED ORDER — POVIDONE-IODINE 10 % EX SWAB
2.0000 | Freq: Once | CUTANEOUS | Status: DC
Start: 2023-12-05 — End: 2023-12-06

## 2023-12-05 MED ORDER — LIDOCAINE HCL (PF) 1 % IJ SOLN
INTRAMUSCULAR | Status: DC | PRN
  Administered 2023-12-05: 20 mL
  Administered 2023-12-05: 60 mL
  Administered 2023-12-05: 5 mL

## 2023-12-05 MED ORDER — LIDOCAINE HCL 1 % IJ SOLN
INTRAMUSCULAR | Status: AC
  Filled 2023-12-05: qty 60

## 2023-12-05 MED ORDER — SODIUM CHLORIDE 0.9 % IV SOLN: 80.0000 mg | INTRAVENOUS | Status: DC

## 2023-12-05 MED ORDER — CHLORHEXIDINE GLUCONATE 4 % EX SOLN
4.0000 | Freq: Once | CUTANEOUS | Status: DC
  Filled 2023-12-05: qty 60

## 2023-12-05 MED ORDER — ACETAMINOPHEN 325 MG PO TABS: 325.0000 mg | ORAL_TABLET | ORAL | Status: DC | PRN

## 2023-12-05 MED ORDER — SODIUM CHLORIDE 0.9 % IV SOLN
INTRAVENOUS | Status: AC
  Filled 2023-12-05: qty 2

## 2023-12-05 MED ORDER — ONDANSETRON HCL 4 MG/2ML IJ SOLN: 4.0000 mg | Freq: Four times a day (QID) | INTRAMUSCULAR | Status: DC | PRN

## 2023-12-05 MED ORDER — CEFAZOLIN SODIUM-DEXTROSE 2-4 GM/100ML-% IV SOLN
INTRAVENOUS | Status: AC
  Administered 2023-12-05: 2 g via INTRAVENOUS
  Filled 2023-12-05: qty 100

## 2023-12-05 SURGICAL SUPPLY — 11 items
CABLE SURGICAL S-101-97-12 (CABLE) ×1 IMPLANT
CATH RIGHTSITE C315HIS02 (CATHETERS) IMPLANT
IPG PACE AZUR XT DR MRI W1DR01 (Pacemaker) IMPLANT
LEAD CAPSURE NOVUS 5076-52CM (Lead) IMPLANT
LEAD SELECT SECURE 3830 383069 (Lead) IMPLANT
PACK LOOP INSERTION (CUSTOM PROCEDURE TRAY) ×1 IMPLANT
PAD DEFIB RADIO PHYSIO CONN (PAD) ×1 IMPLANT
SHEATH 7FR PRELUDE SNAP 13 (SHEATH) IMPLANT
SLITTER 6232ADJ (MISCELLANEOUS) IMPLANT
TRAY PACEMAKER INSERTION (PACKS) ×1 IMPLANT
WIRE HI TORQ VERSACORE-J 145CM (WIRE) IMPLANT

## 2023-12-05 NOTE — Telephone Encounter (Signed)
 Spoke with pt's Daughter and notified her that today's procedure with Dr. Inocencio PPM Implant has been moved up to 2:30 pm instead of 5:00 pm.   She will try her best to have her there by 12-12:30 pm.

## 2023-12-05 NOTE — Interval H&P Note (Signed)
 History and Physical Interval Note:  12/05/2023 1:05 PM  Brandy Miranda  has presented today for surgery, with the diagnosis of bradicardia.  The various methods of treatment have been discussed with the patient and family. After consideration of risks, benefits and other options for treatment, the patient has consented to  Procedure(s): PACEMAKER IMPLANT (N/A) LOOP RECORDER REMOVAL (N/A) as a surgical intervention.  The patient's history has been reviewed, patient examined, no change in status, stable for surgery.  I have reviewed the patient's chart and labs.  Questions were answered to the patient's satisfaction.     Dani Wallner [employer]

## 2023-12-05 NOTE — Progress Notes (Signed)
 Discharge instructions reviewed with patient her two daughters  Nathanel and, Inocente. All questions answered. Verbalized understanding. PT ambulated to the bathroom was able to void. Tolerated fluids. No s/s of complications at the incision site. PT escorted from the unit via wheel chair to personal vehicle.

## 2023-12-05 NOTE — Discharge Instructions (Addendum)
 Care After Your Loop Recorder  You have a Medtronic Loop Recorder   Monitor your cardiac device site for redness, swelling, and drainage. Call the device clinic at [phone] if you experience these symptoms or fever/chills.  If you notice bleeding from your site, hold firm, but gently pressure with two fingers for 5 minutes. Dried blood on the steri-strips when removing the outer bandage is normal.   Keep the large square bandage on your site for 24 hours and then you may remove it yourself. Keep the steri-strips underneath in place.   You may shower after 72 hours / 3 days from your procedure with the steri-strips in place. They will usually fall off on their own, or may be removed after 10 days. Pat dry.   Avoid lotions, ointments, or perfumes over your incision until it is well-healed.  Please do not submerge in water until your site is completely healed.   Your device is MRI compatible.   Remote monitoring is used to monitor your cardiac device from home. This monitoring is scheduled every month by our office. It allows us  to keep an eye on the function of your device to ensure it is working properly.      After Your ICD (Implantable Cardiac Defibrillator)   You have a Medtronic ICD  If you have a Medtronic or Biotronik device, plug in your home monitor once you get home, and no manual interaction is required.   If you have an [employer] or [employer] device, plug your home monitor once you get home, sit near the device, and press the large activation button. Sit nearby until the process is complete, usually notated by lights on the monitor.   If you were set up for monitoring using an app on your phone, make sure the app remains open in the background and the Bluetooth remains on.  ACTIVITY Do not lift your arm above shoulder height for 1 week after your procedure. After 7 days, you may progress as below.  You should remove your sling 24 hours after your procedure,  unless otherwise instructed by your provider.     Thursday December 12, 2023  Friday December 13, 2023 Saturday December 14, 2023 Sunday December 15, 2023   Do not lift, push, pull, or carry anything over 10 pounds with the affected arm until 6 weeks (Thursday January 16, 2024 ) after your procedure.   You may drive AFTER your wound check, unless you have been told otherwise by your provider.   Ask your healthcare provider when you can go back to work   INCISION/Dressing If you are on a blood thinner such as Coumadin, Xarelto, Eliquis, Plavix, or Pradaxa please confirm with your provider when this should be resumed.   If large square, outer bandage is left in place, this can be removed after 24 hours from your procedure. Do not remove steri-strips or glue as below.   Monitor your defibrillator site for redness, swelling, and drainage. Call the device clinic at [phone] if you experience these symptoms or fever/chills.  If your incision is sealed with Steri-strips or staples, you may shower 7 days after your procedure or when told by your provider. Do not remove the steri-strips or let the shower hit directly on your site. You may wash around your site with soap and water.    If you were discharged in a sling, please do not wear this during the day more than 48 hours after your surgery unless otherwise instructed. This may  increase the risk of stiffness and soreness in your shoulder.   Avoid lotions, ointments, or perfumes over your incision until it is well-healed.  You may use a hot tub or a pool AFTER your wound check appointment if the incision is completely closed.  Your ICD is designed to protect you from life threatening heart rhythms. Because of this, you may receive a shock.   1 shock with no symptoms:  Call the office during business hours. 1 shock with symptoms (chest pain, chest pressure, dizziness, lightheadedness, shortness of breath, overall feeling unwell):  Call 911. If  you experience 2 or more shocks in 24 hours:  Call 911. If you receive a shock, you should not drive for 6 months per the [place] DMV IF you receive appropriate therapy from your ICD.   ICD Alerts:  Some alerts are vibratory and others beep. These are NOT emergencies. Please call our office to let us  know. If this occurs at night or on weekends, it can wait until the next business day. Send a remote transmission.  If your device is capable of reading fluid status (for heart failure), you will be offered monthly monitoring to review this with you.   DEVICE MANAGEMENT Remote monitoring is used to monitor your ICD from home. This monitoring is scheduled every 91 days by our office. It allows us  to keep an eye on the functioning of your device to ensure it is working properly. You will routinely see your Electrophysiologist annually (more often if necessary). This will appear as a REMOTE check on your MyChart schedule. These are automatic and there is nothing for you to manually do unless otherwise instructed.  You should receive your ID card for your new device in 4-8 weeks. Keep this card with you at all times once received. Consider wearing a medical alert bracelet or necklace.  Your ICD  may be MRI compatible. This will be discussed at your next office visit/wound check.  You should avoid contact with strong electric or magnetic fields.   Do not use amateur (ham) radio equipment or electric (arc) welding torches. MP3 player headphones with magnets should not be used. Some devices are safe to use if held at least 12 inches (30 cm) from your defibrillator. These include power tools, lawn mowers, and speakers. If you are unsure if something is safe to use, ask your health care provider.  When using your cell phone, hold it to the ear that is on the opposite side from the defibrillator. Do not leave your cell phone in a pocket over the defibrillator.  You may safely use electric blankets, heating pads,  computers, and microwave ovens.  Call the office right away if: You have chest pain. You feel more than one shock. You feel more short of breath than you have felt before. You feel more light-headed than you have felt before. Your incision starts to open up.  This information is not intended to replace advice given to you by your health care provider. Make sure you discuss any questions you have with your health care provider.

## 2023-12-06 ENCOUNTER — Encounter (HOSPITAL_COMMUNITY): Payer: Self-pay | Admitting: Cardiology

## 2023-12-10 ENCOUNTER — Telehealth: Payer: Self-pay

## 2023-12-10 ENCOUNTER — Other Ambulatory Visit (HOSPITAL_COMMUNITY): Payer: Self-pay

## 2023-12-10 ENCOUNTER — Ambulatory Visit (HOSPITAL_COMMUNITY)
Admission: RE | Admit: 2023-12-10 | Discharge: 2023-12-10 | Disposition: A | Discharge status: Home / Self Care | Source: Ambulatory Visit | Attending: Internal Medicine | Admitting: Internal Medicine

## 2023-12-10 ENCOUNTER — Encounter (HOSPITAL_COMMUNITY): Payer: Self-pay | Admitting: Internal Medicine

## 2023-12-10 VITALS — BP 144/54 | HR 84 | Ht 60.0 in | Wt 126.8 lb

## 2023-12-10 DIAGNOSIS — I4891 Unspecified atrial fibrillation: Secondary | ICD-10-CM

## 2023-12-10 DIAGNOSIS — D6869 Other thrombophilia: Secondary | ICD-10-CM

## 2023-12-10 DIAGNOSIS — I480 Paroxysmal atrial fibrillation: Secondary | ICD-10-CM

## 2023-12-10 DIAGNOSIS — C4359 Malignant melanoma of other part of trunk: Principal | ICD-10-CM

## 2023-12-10 MED ORDER — DABIGATRAN ETEXILATE MESYLATE 75 MG PO CAPS: 75.0000 mg | ORAL_CAPSULE | Freq: Two times a day (BID) | ORAL | 6 refills | Status: DC

## 2023-12-10 MED ORDER — METOPROLOL SUCCINATE ER 25 MG PO TB24: 12.5000 mg | ORAL_TABLET | Freq: Every day | ORAL | 3 refills | Status: DC

## 2023-12-10 NOTE — Patient Instructions (Addendum)
 Pradaxa 75 mg twice daily  Metoprolol  12.5 mg ( 1/2 tablet ) at bedtime    Get lab work in one month 01/10/24  at any [facility]  [facility] - [address] ([facility]) - [address] [phone] [address]

## 2023-12-10 NOTE — Progress Notes (Addendum)
 Primary Care Physician: Onita Rush, MD Primary Cardiologist: Ozell Fell, MD Electrophysiologist: Will Gladis Norton, MD     Referring Physician: Device clinic      Brandy Miranda is a 89 y.o. female with a history of LBBB, syncope, HTN, AS, HLD, melanoma who presents for consultation in the [facility]. S/p PPM on 12/05/23 for intermittent CHB. Device clinic alert on 10/7 for new Afib. Patient has a CHADS2VASC score of 4.  On evaluation today, patient is currently in NSR. Patient does not believe she had cardiac awareness. No falls in the past year.  Today, she denies symptoms of chest pain, shortness of breath, orthopnea, PND, lower extremity edema, dizziness, presyncope, syncope, bleeding, or neurologic sequela. The patient is tolerating medications without difficulties and is otherwise without complaint today.   she has a BMI of Body mass index is 24.76 kg/m.SABRA Filed Weights   12/10/23 1420  Weight: 57.5 kg    Current Outpatient Medications  Medication Sig Dispense Refill   amLODipine  (NORVASC ) 10 MG tablet Take 5 mg by mouth at bedtime.     Artificial Tear Ointment (DRY EYES OP) Apply 1 drop to eye as needed (Dry eye).     binimetinib (MEKTOVI) 15 MG tablet Take 15 mg by mouth 2 (two) times daily.     encorafenib (BRAFTOVI) 75 MG capsule Take 150 mg by mouth every evening.     hydrALAZINE (APRESOLINE) 10 MG tablet Take 10 mg by mouth daily as needed (If bp is over 160).     olmesartan (BENICAR) 40 MG tablet Take 20 mg by mouth daily.     Olopatadine  HCl (PATADAY  OP) Place 1 drop into both eyes in the morning and at bedtime.     OVER THE COUNTER MEDICATION Take 3 tablets by mouth daily. DoTerra - bone nutrient supplement     OVER THE COUNTER MEDICATION Take 1 capsule by mouth daily. DoTerra - microplex     OVER THE COUNTER MEDICATION Take 1 Dose by mouth daily. [employer]Terra: Alpha CRS, Se-Omega     sertraline (ZOLOFT) 50 MG tablet Take 50 mg by mouth  every evening.     spironolactone (ALDACTONE) 25 MG tablet Take 25 mg by mouth in the morning.     No current facility-administered medications for this encounter.    Atrial Fibrillation Management history:  Previous antiarrhythmic drugs: none Previous cardioversions: none Previous ablations: none Anticoagulation history: none   ROS- All systems are reviewed and negative except as per the HPI above.  Physical Exam: BP (!) 144/54   Pulse 84   Ht 5' (1.524 m)   Wt 57.5 kg   BMI 24.76 kg/m   GEN: Well nourished, well developed in no acute distress NECK: No JVD; No carotid bruits CARDIAC: Regular rate and rhythm, no murmurs, rubs, gallops RESPIRATORY:  Clear to auscultation without rales, wheezing or rhonchi  ABDOMEN: Soft, non-tender, non-distended EXTREMITIES:  No edema; No deformity   EKG today demonstrates  Vent. rate 84 BPM PR interval 214 ms QRS duration 130 ms QT/QTcB 382/451 ms P-R-T axes 50 52 79 Sinus rhythm with 1st degree A-V block with frequent Premature ventricular complexes Non-specific intra-ventricular conduction block Cannot rule out Anterior infarct (cited on or before 05-Dec-2023) Abnormal ECG When compared with ECG of 05-Dec-2023 18:27, Premature ventricular complexes are now Present  Echo 06/27/21 demonstrated  1. Left ventricular ejection fraction, by estimation, is 60 to 65%. The  left ventricle has normal function. The left ventricle  has no regional  wall motion abnormalities. There is moderate concentric left ventricular  hypertrophy. Left ventricular  diastolic parameters are consistent with Grade I diastolic dysfunction  (impaired relaxation).   2. Right ventricular systolic function is normal. The right ventricular  size is normal. There is normal pulmonary artery systolic pressure.   3. The aortic valve is tricuspid. There is moderate calcification of the  aortic valve. There is moderate thickening of the aortic valve. Aortic  valve  regurgitation is trivial. Aortic valve mean gradient measures 10.0  mmHg. Aortic valve Vmax measures  2.13 m/s. Normal stroke volume index (55 cc/m2) with DVI 0.62. AVA by 2d  Planimetry 1.4 cm2. Mild to moderate aortic stenosis.   4. The mitral valve was not well visualized. No evidence of mitral valve  regurgitation. No evidence of mitral stenosis.   5. Tricuspid valve regurgitation is mild to moderate.   6. The inferior vena cava is normal in size with greater than 50%  respiratory variability, suggesting right atrial pressure of 3 mmHg.   ASSESSMENT & PLAN CHA2DS2-VASc Score = 4  The patient's score is based upon: CHF History: 0 HTN History: 1 Diabetes History: 0 Stroke History: 0 Vascular Disease History: 0 Age Score: 2 Gender Score: 1       ASSESSMENT AND PLAN: Paroxysmal Atrial Fibrillation (ICD10:  I48.0) The patient's CHA2DS2-VASc score is 4, indicating a 4.8% annual risk of stroke.    Patient is currently in NSR. Education provided about Afib. Discussion about device alert showing elevated HR with poor rate control. I also note on patient's ECG frequent ectopy. Due to this, will cautiously begin Toprol  12.5 mg at bedtime. Advised daughter that if hypotension results can lower/discontinue amlodipine  if necessary. Patient takes BP daily so they will contact clinic next week with BP readings.  Secondary Hypercoagulable State (ICD10:  D68.69) The patient is at significant risk for stroke/thromboembolism based upon her CHA2DS2-VASc Score of 4.  Start Dabigatran (Pradaxa).  We discussed the reasoning behind anticoagulation in the setting of stroke prevention related to Afib. We discussed the benefits vs risks of anticoagulation. After discussion, patient would like to begin anticoagulation and understands the potential risks. Will begin Pradaxa 75 mg BID due to CrCl 28 mL/min and patient is taking Braftovi; there is a class D interaction between Eliquis and Braftovi. Will print  order for patient to have CBC in 1 month.    Follow up as scheduled with Dr. Inocencio. Follow up Afib clinic prn.    Terra Pac, [facility]  Afib Clinic [address] [place], [place] [address] [phone]

## 2023-12-10 NOTE — Telephone Encounter (Signed)
 Alert received from [employer] for AT/AF Daily Burden > Threshold. AF in progress from 10/7 @ 05:39, poor rate control, burden 10.6%, no hx of PAF per EPIC, no OAC - route to triage high alert per protocol.  Spoke to patients daughter to advise AF was noted on device. Recommend AF clinic referral to see if patient is candidate for [facility]. Patient lives alone and is steady on her feet per daughter. Routing to AF clinic to contact pts daughter for apt.

## 2023-12-10 NOTE — Telephone Encounter (Signed)
-----   Message from Nurse Nancy DEL sent at 12/10/2023  2:45 PM EDT ----- Regarding: Eliquis Can you please price check co pay for Eliquis 2.5 mg bid. Thanks -Karlene RN Afib Clinic

## 2023-12-10 NOTE — Telephone Encounter (Signed)
 Pharmacy Patient Advocate Encounter   Received notification from Physician's Office that prior authorization for ELIQUIS is required/requested.   Insurance verification completed.   The patient is insured through [employer].   Per test claim: The current 30 day co-pay is, $0.  No PA needed at this time. This test claim was processed through [facility]- copay amounts may vary at other pharmacies due to pharmacy/plan contracts, or as the patient moves through the different stages of their insurance plan.

## 2023-12-11 ENCOUNTER — Encounter: Payer: Self-pay | Admitting: Cardiology

## 2023-12-11 NOTE — Progress Notes (Signed)
 Remote Loop Recorder Transmission

## 2023-12-12 NOTE — Addendum Note (Signed)
 Encounter addended by: Janel Nancy SAUNDERS, RN on: 12/12/2023 2:26 PM  Actions taken: Order list changed, Medication long-term status modified

## 2023-12-18 ENCOUNTER — Ambulatory Visit: Attending: Cardiovascular Disease | Admitting: *Deleted

## 2023-12-18 DIAGNOSIS — I4891 Unspecified atrial fibrillation: Secondary | ICD-10-CM | POA: Diagnosis not present

## 2023-12-18 NOTE — Patient Instructions (Signed)

## 2023-12-19 ENCOUNTER — Ambulatory Visit

## 2023-12-19 ENCOUNTER — Encounter: Payer: Self-pay | Admitting: Cardiovascular Disease

## 2023-12-19 LAB — CUP PACEART INCLINIC DEVICE CHECK
Date Time Interrogation Session: 20251015120000
Implantable Lead Connection Status: 753985
Implantable Lead Connection Status: 753985
Implantable Lead Implant Date: 20251002
Implantable Lead Implant Date: 20251002
Implantable Lead Location: 753859
Implantable Lead Location: 753860
Implantable Lead Model: 3830
Implantable Lead Model: 5076
Implantable Pulse Generator Implant Date: 20251002

## 2023-12-19 NOTE — Progress Notes (Signed)
 Normal dual chamber pacemaker wound check. Presenting rhythm: AS/VS. Wound well healed. Routine testing performed. Thresholds, sensing, and impedance consistent with implant measurements and at 3.5V safety margin/auto capture until 3 month visit. Multiple AT/AF episodes noted. Patient started on Pradaxa on 12/10/23. Reviewed arm restrictions to continue for 6 weeks total post op.  Pt enrolled in remote follow-up.  Wound check also performed for ILR explant site from same day as PPM implant. Dressing removed and site not well healed. Edges not approximated and site oozing small amount of sanguineous drainage. This RN requested to have RU, PA-C in clinic to come and assess site. Provider told patient to keep the site clean and dry and to abstain from showering for another 48 hrs minimum to allow ILR site to continue healing and close up, and to leave it open to air. Patient and family member present both verbalized understanding of instructions given by PA-C.

## 2023-12-23 DIAGNOSIS — Z950 Presence of cardiac pacemaker: Principal | ICD-10-CM

## 2023-12-23 DIAGNOSIS — C4359 Malignant melanoma of other part of trunk: Principal | ICD-10-CM

## 2023-12-24 ENCOUNTER — Other Ambulatory Visit (HOSPITAL_COMMUNITY): Payer: Self-pay | Admitting: *Deleted

## 2023-12-24 MED ORDER — DABIGATRAN ETEXILATE MESYLATE 75 MG PO CAPS: 75.0000 mg | ORAL_CAPSULE | Freq: Two times a day (BID) | ORAL | 3 refills | Status: AC

## 2023-12-24 MED ORDER — METOPROLOL SUCCINATE ER 25 MG PO TB24: 12.5000 mg | ORAL_TABLET | Freq: Every day | ORAL | 3 refills | Status: AC

## 2023-12-30 ENCOUNTER — Encounter

## 2023-12-30 ENCOUNTER — Ambulatory Visit

## 2023-12-30 DIAGNOSIS — I4891 Unspecified atrial fibrillation: Secondary | ICD-10-CM | POA: Diagnosis not present

## 2023-12-31 NOTE — Addendum Note (Signed)
 Addended by: ALBERTA THERISA BRAVO on: 12/31/2023 04:31 PM     Modules accepted: Orders

## 2023-12-31 NOTE — Progress Notes (Addendum)
 [facility] Delivery [facility] Clinical Assessment & Refill Coordination Note    Taylor Adkins, DOB: [birth year]  Phone: [phone] (home)     All above HIPAA information was verified with patient's family member, daughter.     Was a nurse, learning disability used for this call? No    Specialty Medication(s):   Hematology/Oncology: Braftovi      Current Medications[1]     Changes to medications: [name] reports starting the following medications: metoprolol and dabigatran    Medication list has been reviewed and updated in Epic: Yes    Allergies[2]    Changes to allergies: No    Allergies have been reviewed and updated in Epic: Yes    SPECIALTY MEDICATION ADHERENCE     Braftovi  75 mg: 0 doses of medicine on hand   Mektovi  15 mg: >30 days of medicine on hand       Medication Adherence    Patient reported X missed doses in the last month: 0  Specialty Medication: Braftovi  75mg   Patient is on additional specialty medications: Yes  Additional Specialty Medications: Mektovi  15mg   Patient Reported Additional Medication X Missed Doses in the Last Month: 0  Informant: patient          Specialty medication(s) dose(s) confirmed: Regimen is correct and unchanged.     Are there any concerns with adherence? No    Adherence counseling provided? Not needed    CLINICAL MANAGEMENT AND INTERVENTION      Clinical Benefit Assessment:    Do you feel the medicine is effective or helping your condition? Yes    Clinical Benefit counseling provided? Not needed    Adverse Effects Assessment:    Are you experiencing any side effects? No    Are you experiencing difficulty administering your medicine? No    Quality of Life Assessment:    Quality of Life    Rheumatology  Oncology  1. What impact has your specialty medication had on the reduction of your daily pain or discomfort level?: None  2. On a scale of 1-10, how would you rate your ability to manage side effects associated with your specialty medication? (1=no issues, 10 = unable to take medication due to side effects): 1  Dermatology  Cystic Fibrosis               Have you discussed this with your provider? Not needed    Acute Infection Status:    Acute infections noted within Epic:  No active infections    Patient reported infection: None    Therapy Appropriateness:    Is the medication and dose appropriate considering the patient???s diagnosis, treatment, and disease journey, comorbidities, medical history, current medications, allergies, therapeutic goals, self-administration ability, and access barriers? Yes, therapy is appropriate and should be continued     Clinical Intervention:    Was an intervention completed as part of this clinical assessment? No    DISEASE/MEDICATION-SPECIFIC INFORMATION      For patients on oral chemotherapy: Next cycle is scheduled to start 11/10.    Oncology: Is the patient receiving adequate infection prevention treatment? Not applicable  Does the patient have adequate nutritional support? Not applicable    PATIENT SPECIFIC NEEDS     Does the patient have any physical, cognitive, or cultural barriers? No    Is the patient high risk? Yes, patient is taking oral chemotherapy. Appropriateness of therapy as been assessed    Does the patient require physician intervention or other additional services (i.e., nutrition, smoking  cessation, social work)? No    Does the patient have an additional or emergency contact listed in their chart? Yes    SOCIAL DETERMINANTS OF HEALTH     At the [facility], we have learned that life circumstances - like trouble affording food, housing, utilities, or transportation can affect the health of many of our patients.   That is why we wanted to ask: are you currently experiencing any life circumstances that are negatively impacting your health and/or quality of life? Patient declined to answer    Social Drivers of Health     Food Insecurity: No Food Insecurity (11/22/2023)    Hunger Vital Sign     Worried About Running Out of Food in the Last Year: Never true     Ran Out of Food in the Last Year: Never true   Tobacco Use: Low Risk  (07/15/2023)    Received from [facility]    Patient History     Smoking Tobacco Use: Never     Smokeless Tobacco Use: Never     Passive Exposure: Not on file   Transportation Needs: No Transportation Needs (11/22/2023)    PRAPARE - Transportation     Lack of Transportation (Medical): No     Lack of Transportation (Non-Medical): No   Alcohol Use: Not on file   Housing: Low Risk  (11/22/2023)    Housing     Within the past 12 months, have you ever stayed: outside, in a car, in a tent, in an overnight shelter, or temporarily in someone else's home (i.e. couch-surfing)?: No     Are you worried about losing your housing?: No   Physical Activity: Not on file   Utilities: Low Risk  (11/22/2023)    Utilities     Within the past 12 months, have you been unable to get utilities (heat, electricity) when it was really needed?: No   Stress: Not on file   Interpersonal Safety: Not At Risk (11/22/2023)    Interpersonal Safety     Unsafe Where You Currently Live: No     Physically Hurt by Anyone: No     Abused by Anyone: No   Substance Use: Not on file (01/08/2023)   Intimate Partner Violence: Not on file   Social Connections: Not on file   Financial Resource Strain: Not on file   Health Literacy: Not on file   Internet Connectivity: Not on file       Would you be willing to receive help with any of the needs that you have identified today? Not applicable       SHIPPING     Specialty Medication(s) to be Shipped:   Hematology/Oncology: Braftovi     Other medication(s) to be shipped: No additional medications requested for fill at this time    Specialty Medications not needed at this time: Hematology/Oncology: Mektovi      Changes to insurance: No    Cost and Payment: Patient has a $0 copay, payment information is not required.    Delivery Scheduled: Yes, Expected medication delivery date: 11/4.     Medication will be delivered via UPS to the confirmed prescription address in [facility].    The patient will receive a drug information handout for each medication shipped and additional FDA Medication Guides as required.  Verified that patient has previously received a Conservation Officer, Historic Buildings and a Surveyor, Mining.    The patient or caregiver noted above participated in the development of this care plan and  knows that they can request review of or adjustments to the care plan at any time.      All of the patient's questions and concerns have been addressed.    Therisa FORBES Ra, PharmD   [facility] Specialty Pharmacist         [1]   Current Outpatient Medications   Medication Sig Dispense Refill    amLODIPine (NORVASC) 10 MG tablet Take 1 tablet (10 mg total) by mouth.      atorvastatin (LIPITOR) 20 MG tablet  (Patient not taking: Reported on 04/12/2023)      binimetinib  (MEKTOVI ) 15 mg tablet Take 1 tablet (15 mg total) by mouth two (2) times a day. Take for 9 days on, 21 days off. 60 tablet 11    encorafenib  (BRAFTOVI ) 75 mg capsule Take 2 capsules (150 mg total) by mouth daily. Take for 9 days on, 21 days off. 60 capsule 11    hydrALAZINE  (APRESOLINE ) 10 MG tablet Take 1 tablet (10 mg total) by mouth.      multivitamin,ther and minerals (MULTI-VITAMIN HP/MINERALS ORAL) Take 1 tablet by mouth daily.      olmesartan (BENICAR) 40 MG tablet Take 1 tablet (40 mg total) by mouth daily.      olopatadine (PATANOL) 0.1 % ophthalmic solution Apply to eye.      sertraline (ZOLOFT) 50 MG tablet Take 1 tablet (50 mg total) by mouth daily.      spironolactone  (ALDACTONE ) 25 MG tablet Take 1 tablet (25 mg total) by mouth daily. 90 tablet 3     No current facility-administered medications for this visit.   [2] No Known Allergies

## 2024-01-01 ENCOUNTER — Ambulatory Visit: Payer: Self-pay | Admitting: Cardiology

## 2024-01-01 LAB — CUP PACEART REMOTE DEVICE CHECK
Battery Remaining Longevity: 172 mo
Battery Voltage: 3.22 V
Brady Statistic AP VP Percent: 0.09 %
Brady Statistic AP VS Percent: 37.22 %
Brady Statistic AS VP Percent: 0.04 %
Brady Statistic AS VS Percent: 62.66 %
Brady Statistic RA Percent Paced: 38.73 %
Brady Statistic RV Percent Paced: 0.14 %
Date Time Interrogation Session: 20251026193132
Implantable Lead Connection Status: 753985
Implantable Lead Connection Status: 753985
Implantable Lead Implant Date: 20251002
Implantable Lead Implant Date: 20251002
Implantable Lead Location: 753859
Implantable Lead Location: 753860
Implantable Lead Model: 3830
Implantable Lead Model: 5076
Implantable Pulse Generator Implant Date: 20251002
Lead Channel Impedance Value: 285 Ohm
Lead Channel Impedance Value: 399 Ohm
Lead Channel Impedance Value: 399 Ohm
Lead Channel Impedance Value: 532 Ohm
Lead Channel Pacing Threshold Amplitude: 0.625 V
Lead Channel Pacing Threshold Amplitude: 0.75 V
Lead Channel Pacing Threshold Pulse Width: 0.4 ms
Lead Channel Pacing Threshold Pulse Width: 0.4 ms
Lead Channel Sensing Intrinsic Amplitude: 11.875 mV
Lead Channel Sensing Intrinsic Amplitude: 11.875 mV
Lead Channel Sensing Intrinsic Amplitude: 2.875 mV
Lead Channel Sensing Intrinsic Amplitude: 2.875 mV
Lead Channel Setting Pacing Amplitude: 3.5 V
Lead Channel Setting Pacing Amplitude: 3.5 V
Lead Channel Setting Pacing Pulse Width: 0.4 ms
Lead Channel Setting Sensing Sensitivity: 0.9 mV
Zone Setting Status: 755011
Zone Setting Status: 755011

## 2024-01-02 NOTE — Progress Notes (Signed)
 Remote PPM Transmission

## 2024-01-03 ENCOUNTER — Ambulatory Visit: Payer: Self-pay | Admitting: Cardiology

## 2024-01-03 LAB — LAB REPORT - SCANNED: EGFR (Non-African Amer.): 51.5

## 2024-01-06 ENCOUNTER — Encounter: Payer: Self-pay | Admitting: Cardiology

## 2024-01-20 ENCOUNTER — Ambulatory Visit

## 2024-01-29 NOTE — Progress Notes (Signed)
 [facility] Specialty and Home Delivery Pharmacy Refill Coordination Note    Specialty Medication(s) to be Shipped:   Hematology/Oncology: Mektovi     Other medication(s) to be shipped: No additional medications requested for fill at this time    Specialty Medications not needed at this time: N/A     Taylor Adkins, DOB: 09/08/1926  Phone: [phone] (home)       All above HIPAA information was verified with patient's family member, daughter.     Was a nurse, learning disability used for this call? No    Completed refill call assessment today to schedule patient's medication shipment from the [facility]  [phone]).  All relevant notes have been reviewed.     Specialty medication(s) and dose(s) confirmed: Regimen is correct and unchanged.   Changes to medications: Taylor Adkins reports no changes at this time.  Changes to insurance: No  New side effects reported not previously addressed with a pharmacist or physician: None reported  Questions for the pharmacist: No    Confirmed patient received a Conservation Officer, Historic Buildings and a Surveyor, Mining with first shipment. The patient will receive a drug information handout for each medication shipped and additional FDA Medication Guides as required.       DISEASE/MEDICATION-SPECIFIC INFORMATION        N/A    SPECIALTY MEDICATION ADHERENCE     Medication Adherence    Patient reported X missed doses in the last month: 0  Specialty Medication: MEKTOVI  15 mg tablet (binimetinib )  Patient is on additional specialty medications: No              Were doses missed due to medication being on hold? No    MEKTOVI  15 mg tablet (binimetinib ): 14 days of medicine on hand     REFERRAL TO PHARMACIST     Referral to the pharmacist: Not needed      [facility]     Shipping address confirmed in Epic.     Cost and Payment: Patient has a $0 copay, payment information is not required.    Delivery Scheduled: Yes, Expected medication delivery date: 02/14/24.     Medication will be delivered via UPS to the prescription address in Epic [facility].    Taylor Adkins   [facility]  Specialty Technician

## 2024-02-13 MED FILL — MEKTOVI 15 MG TABLET: ORAL | 90 days supply | Qty: 54 | Fill #1

## 2024-02-20 ENCOUNTER — Ambulatory Visit

## 2024-03-09 NOTE — Progress Notes (Unsigned)
 " Cardiology Office Note:  .   Date:  03/09/2024  ID:  Brandy Miranda, DOB 05/02/1926, MRN [id number] PCP: Brandy Rush, MD  [facility] Providers Cardiologist:  Brandy Fell, MD Electrophysiologist:  Brandy Gladis Norton, MD {  History of Present Illness: .   Brandy Miranda is a 89 y.o. female w/PMHx of  HTN, HLD, melanoma PVCs LBBB AFib  She was referred to Dr. Ida for recurrent syncope > ILR implanted.  She saw Brandy Miranda 11/29/23 after ILR alert for pause, CHB, planned for PPM  PPM implanted 12/05/23  12/10/23 saw the AFib clinic for new Afib found via her device, started on low dose Toprol  for rate control and Pradaxa  with risk score of 4 (there is a class D interaction between Eliquis and Braftovi) and follow up labs   Today's visit is scheduled as her 90 day post implant visit ROS:   She comes today accompanied by her daughter She is doing well Tolerating her medications No CP, palpitations or cardiac awareness No pacer/pocket concerns No SOB No near syncope or syncope No bleeding or signs of bleeding  Device information MDT ILR implanted 11/29/23 (recurrent syncope) >> explanted 12/05/23 MDT dual chamber PPM implanted 12/05/23  Arrhythmia/AAD hx Afib found via PPM Octo 2025  Studies Reviewed: SABRA    EKG not done today  DEVICE interrogation done today and reviewed by myself Battery and lead measurements are good Episodes labeled NSVT look atrially driven, as well by morphology are SVT AF burden 0.4%   TTE 06/27/21   1. Left ventricular ejection fraction, by estimation, is 60 to 65%. The  left ventricle has normal function. The left ventricle has no regional  wall motion abnormalities. There is moderate concentric left ventricular  hypertrophy. Left ventricular  diastolic parameters are consistent with Grade I diastolic dysfunction  (impaired relaxation).   2. Right ventricular systolic function is normal. The right ventricular  size is normal. There is  normal pulmonary artery systolic pressure.   3. The aortic valve is tricuspid. There is moderate calcification of the  aortic valve. There is moderate thickening of the aortic valve. Aortic  valve regurgitation is trivial. Aortic valve mean gradient measures 10.0  mmHg. Aortic valve Vmax measures  2.13 m/s. Normal stroke volume index (55 cc/m2) with DVI 0.62. AVA by 2d  Planimetry 1.4 cm2. Mild to moderate aortic stenosis.   4. The mitral valve was not well visualized. No evidence of mitral valve  regurgitation. No evidence of mitral stenosis.   5. Tricuspid valve regurgitation is mild to moderate.   6. The inferior vena cava is normal in size with greater than 50%  respiratory variability, suggesting right atrial pressure of 3 mmHg.    Cardiac monitor 08/06/2021 personally reviewed The basic rhythm is normal sinus with an average HR of 68 bpm No atrial fibrillation or flutter No high-grade heart block or pathologic pauses There are occasional PVC's with a burden of 4.9% and rare supraventricular beats without sustained arrhythmias   Risk Assessment/Calculations:    Physical Exam:   VS:  There were no vitals taken for this visit.   Wt Readings from Last 3 Encounters:  12/10/23 126 lb 12.8 oz (57.5 kg)  12/05/23 122 lb (55.3 kg)  11/29/23 125 lb 3.2 oz (56.8 kg)    GEN: Well nourished, well developed in no acute distress NECK: No JVD; No carotid bruits CARDIAC: RRR, 2/6 SM, rubs, gallops RESPIRATORY:  CTA b/l without rales, wheezing or rhonchi  ABDOMEN:  Soft, non-tender, non-distended EXTREMITIES:  No edema; No deformity   PPM site: well healed, is stable, no thinning, fluctuation, tethering  ASSESSMENT AND PLAN: .    PPM intact function no programming changes made  paroxysmal AFib CHA2DS2Vasc is 4, on Pradaxa , appropriately dosed 0.4 % burden Labs today  Secondary hypercoagulable state 2/2 AFib   Dispo: remotes as usual, in clinic in a year, sooner if  needed  Signed, Brandy Macario Arthur, PA-C   "

## 2024-03-10 ENCOUNTER — Ambulatory Visit: Admitting: Student

## 2024-03-10 ENCOUNTER — Encounter: Admitting: Cardiology

## 2024-03-11 ENCOUNTER — Ambulatory Visit: Admitting: Physician Assistant

## 2024-03-11 ENCOUNTER — Ambulatory Visit: Payer: Self-pay | Admitting: Cardiology

## 2024-03-11 VITALS — BP 130/70 | HR 81 | Ht 60.0 in | Wt 126.0 lb

## 2024-03-11 DIAGNOSIS — D6869 Other thrombophilia: Secondary | ICD-10-CM

## 2024-03-11 DIAGNOSIS — Z950 Presence of cardiac pacemaker: Secondary | ICD-10-CM | POA: Diagnosis not present

## 2024-03-11 DIAGNOSIS — I480 Paroxysmal atrial fibrillation: Secondary | ICD-10-CM

## 2024-03-11 LAB — CUP PACEART INCLINIC DEVICE CHECK
Battery Remaining Longevity: 171 mo
Battery Voltage: 3.21 V
Brady Statistic AP VP Percent: 0.04 %
Brady Statistic AP VS Percent: 34.09 %
Brady Statistic AS VP Percent: 0.03 %
Brady Statistic AS VS Percent: 65.85 %
Brady Statistic RA Percent Paced: 35.04 %
Brady Statistic RV Percent Paced: 0.07 %
Date Time Interrogation Session: 20260107115332
Implantable Lead Connection Status: 753985
Implantable Lead Connection Status: 753985
Implantable Lead Implant Date: 20251002
Implantable Lead Implant Date: 20251002
Implantable Lead Location: 753859
Implantable Lead Location: 753860
Implantable Lead Model: 3830
Implantable Lead Model: 5076
Implantable Pulse Generator Implant Date: 20251002
Lead Channel Impedance Value: 285 Ohm
Lead Channel Impedance Value: 418 Ohm
Lead Channel Impedance Value: 570 Ohm
Lead Channel Impedance Value: 570 Ohm
Lead Channel Pacing Threshold Amplitude: 0.625 V
Lead Channel Pacing Threshold Amplitude: 1.125 V
Lead Channel Pacing Threshold Pulse Width: 0.4 ms
Lead Channel Pacing Threshold Pulse Width: 0.4 ms
Lead Channel Sensing Intrinsic Amplitude: 12.375 mV
Lead Channel Sensing Intrinsic Amplitude: 18.125 mV
Lead Channel Sensing Intrinsic Amplitude: 2.875 mV
Lead Channel Sensing Intrinsic Amplitude: 3.875 mV
Lead Channel Setting Pacing Amplitude: 2 V
Lead Channel Setting Pacing Amplitude: 2.25 V
Lead Channel Setting Pacing Pulse Width: 0.4 ms
Lead Channel Setting Sensing Sensitivity: 0.9 mV
Zone Setting Status: 755011
Zone Setting Status: 755011

## 2024-03-11 NOTE — Patient Instructions (Signed)
 Medication Instructions:    Your physician recommends that you continue on your current medications as directed. Please refer to the Current Medication list given to you today.   *If you need a refill on your cardiac medications before your next appointment, please call your pharmacy*  Lab Work:  PLEASE GO DOWN STAIRS  [facility]  FIRST FLOOR   ( GET OFF ELEVATORS WALK TOWARDS WAITING AREA LAB LOCATED BY PHARMACY):  BMET AND CBC TODAY      If you have labs (blood work) drawn today and your tests are completely normal, you will receive your results only by: MyChart Message (if you have MyChart) OR A paper copy in the mail If you have any lab test that is abnormal or we need to change your treatment, we will call you to review the results.   Testing/Procedures:  NONE ORDERED  TODAY     Follow-Up: At [facility], you and your health needs are our priority.  As part of our continuing mission to provide you with exceptional heart care, our providers are all part of one team.  This team includes your primary Cardiologist (physician) and Advanced Practice Providers or APPs (Physician Assistants and Nurse Practitioners) who all work together to provide you with the care you need, when you need it.  Your next appointment:   1 year(s)  Provider:   Soyla Norton, MD or Charlies Arthur, PA-C     We recommend signing up for the patient portal called MyChart.  Sign up information is provided on this After Visit Summary.  MyChart is used to connect with patients for Virtual Visits (Telemedicine).  Patients are able to view lab/test results, encounter notes, upcoming appointments, etc.  Non-urgent messages can be sent to your provider as well.   To learn more about what you can do with MyChart, go to [email or web address].   Other Instructions

## 2024-03-11 NOTE — Addendum Note (Signed)
 Addended by: RUBBIE KERRI HERO on: 03/11/2024 11:33 AM   Modules accepted: Orders

## 2024-03-12 LAB — BASIC METABOLIC PANEL WITH GFR
BUN/Creatinine Ratio: 30 — ABNORMAL HIGH (ref 12–28)
BUN: 32 mg/dL (ref 10–36)
CO2: 23 mmol/L (ref 20–29)
Calcium: 9.4 mg/dL (ref 8.7–10.3)
Chloride: 104 mmol/L (ref 96–106)
Creatinine, Ser: 1.06 mg/dL — ABNORMAL HIGH (ref 0.57–1.00)
Glucose: 93 mg/dL (ref 70–99)
Potassium: 5 mmol/L (ref 3.5–5.2)
Sodium: 141 mmol/L (ref 134–144)
eGFR: 48 mL/min/1.73 — ABNORMAL LOW

## 2024-03-12 LAB — CBC
Hematocrit: 36 % (ref 34.0–46.6)
Hemoglobin: 11.8 g/dL (ref 11.1–15.9)
MCH: 31.1 pg (ref 26.6–33.0)
MCHC: 32.8 g/dL (ref 31.5–35.7)
MCV: 95 fL (ref 79–97)
Platelets: 217 x10E3/uL (ref 150–450)
RBC: 3.79 x10E6/uL (ref 3.77–5.28)
RDW: 13.3 % (ref 11.7–15.4)
WBC: 4.4 x10E3/uL (ref 3.4–10.8)

## 2024-03-23 ENCOUNTER — Ambulatory Visit

## 2024-03-27 NOTE — Progress Notes (Signed)
 [facility] Refill Coordination Note    Specialty Medication(s) to be Shipped:   Hematology/Oncology: Braftovi     Other medication(s) to be shipped: No additional medications requested for fill at this time    Specialty Medications not needed at this time: N/A     Taylor Adkins, DOB: [birth year]  Phone: [phone] (home)       All above HIPAA information was verified with patient's family member, Daughter.     Was a nurse, learning disability used for this call? No    Completed refill call assessment today to schedule patient's medication shipment from the [facility]  [phone]).  All relevant notes have been reviewed.     Specialty medication(s) and dose(s) confirmed: Regimen is correct and unchanged.   Changes to medications: Taylor Adkins reports no changes at this time.  Changes to insurance: No  New side effects reported not previously addressed with a pharmacist or physician: None reported  Questions for the pharmacist: No    Confirmed patient received a Conservation Officer, Historic Buildings and a Surveyor, Mining with first shipment. The patient will receive a drug information handout for each medication shipped and additional FDA Medication Guides as required.       DISEASE/MEDICATION-SPECIFIC INFORMATION        N/A    SPECIALTY MEDICATION ADHERENCE     Medication Adherence    Patient reported X missed doses in the last month: 0  Specialty Medication: Braftovi  75 mg  Patient is on additional specialty medications: No  Informant: patient     Were doses missed due to medication being on hold? No    Braftovi  75 mg: 2 days of medicine on hand       Specialty medication is an injection or given on a cycle: Yes, Next cycle is scheduled to start 04/13/24.    REFERRAL TO PHARMACIST     Referral to the pharmacist: Not needed      [facility]     Shipping address confirmed in Epic.     Cost and Payment: Patient has a copay of $280.40. They are aware and have authorized the pharmacy to charge the credit card on file.    Delivery Scheduled: Yes, Expected medication delivery date: 04/09/24.     Medication will be delivered via UPS to the prescription address in Epic [place].    Suzzanne Brunkhorst M Santer Torres   [facility]  Specialty Technician

## 2024-03-30 ENCOUNTER — Encounter

## 2024-03-30 ENCOUNTER — Ambulatory Visit

## 2024-03-30 DIAGNOSIS — R55 Syncope and collapse: Secondary | ICD-10-CM | POA: Diagnosis not present

## 2024-03-31 DIAGNOSIS — C4359 Malignant melanoma of other part of trunk: Principal | ICD-10-CM

## 2024-04-01 ENCOUNTER — Telehealth: Payer: Self-pay | Admitting: Cardiology

## 2024-04-01 NOTE — Telephone Encounter (Signed)
 Called re missed remote

## 2024-04-02 ENCOUNTER — Ambulatory Visit: Payer: Self-pay | Admitting: Cardiology

## 2024-04-02 ENCOUNTER — Encounter: Payer: Self-pay | Admitting: Cardiology

## 2024-04-02 LAB — CUP PACEART REMOTE DEVICE CHECK
Battery Remaining Longevity: 168 mo
Battery Voltage: 3.21 V
Brady Statistic AP VP Percent: 0.02 %
Brady Statistic AP VS Percent: 33.78 %
Brady Statistic AS VP Percent: 0.02 %
Brady Statistic AS VS Percent: 66.18 %
Brady Statistic RA Percent Paced: 35.16 %
Brady Statistic RV Percent Paced: 0.05 %
Date Time Interrogation Session: 20260128140913
Implantable Lead Connection Status: 753985
Implantable Lead Connection Status: 753985
Implantable Lead Implant Date: 20251002
Implantable Lead Implant Date: 20251002
Implantable Lead Location: 753859
Implantable Lead Location: 753860
Implantable Lead Model: 3830
Implantable Lead Model: 5076
Implantable Pulse Generator Implant Date: 20251002
Lead Channel Impedance Value: 266 Ohm
Lead Channel Impedance Value: 380 Ohm
Lead Channel Impedance Value: 456 Ohm
Lead Channel Impedance Value: 494 Ohm
Lead Channel Pacing Threshold Amplitude: 0.625 V
Lead Channel Pacing Threshold Amplitude: 1 V
Lead Channel Pacing Threshold Pulse Width: 0.4 ms
Lead Channel Pacing Threshold Pulse Width: 0.4 ms
Lead Channel Sensing Intrinsic Amplitude: 14.25 mV
Lead Channel Sensing Intrinsic Amplitude: 14.25 mV
Lead Channel Sensing Intrinsic Amplitude: 3.125 mV
Lead Channel Sensing Intrinsic Amplitude: 3.125 mV
Lead Channel Setting Pacing Amplitude: 2 V
Lead Channel Setting Pacing Amplitude: 2.25 V
Lead Channel Setting Pacing Pulse Width: 0.4 ms
Lead Channel Setting Sensing Sensitivity: 0.9 mV
Zone Setting Status: 755011
Zone Setting Status: 755011

## 2024-04-08 MED FILL — BRAFTOVI 75 MG CAPSULE: ORAL | 90 days supply | Qty: 60 | Fill #1

## 2024-04-08 NOTE — Progress Notes (Signed)
 Remote PPM Transmission

## 2024-04-23 ENCOUNTER — Ambulatory Visit

## 2024-05-25 ENCOUNTER — Ambulatory Visit

## 2024-06-25 ENCOUNTER — Ambulatory Visit

## 2024-06-29 ENCOUNTER — Encounter

## 2024-09-28 ENCOUNTER — Encounter
